# Patient Record
Sex: Female | Born: 1985 | Race: White | Hispanic: No | Marital: Married | Smoking: Never smoker
Health system: Southern US, Community
[De-identification: ages and names within clinical notes are randomized; demographics above are authoritative.]

## PROBLEM LIST (undated history)

## (undated) ENCOUNTER — Inpatient Hospital Stay (HOSPITAL_COMMUNITY): Payer: Self-pay

## (undated) DIAGNOSIS — J302 Other seasonal allergic rhinitis: Secondary | ICD-10-CM

## (undated) DIAGNOSIS — F988 Other specified behavioral and emotional disorders with onset usually occurring in childhood and adolescence: Secondary | ICD-10-CM

## (undated) HISTORY — PX: ELBOW SURGERY: SHX618

## (undated) HISTORY — PX: WISDOM TOOTH EXTRACTION: SHX21

---
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
## 2006-02-17 ENCOUNTER — Other Ambulatory Visit: Admission: RE | Admit: 2006-02-17 | Discharge: 2006-02-17 | Payer: Self-pay | Admitting: Obstetrics & Gynecology

## 2007-02-22 ENCOUNTER — Other Ambulatory Visit: Admission: RE | Admit: 2007-02-22 | Discharge: 2007-02-22 | Payer: Self-pay | Admitting: Obstetrics and Gynecology

## 2007-05-12 IMAGING — US US ABDOMEN COMPLETE
1 series · 14 of 25 positions shown · non-contrast
Comparison: none

CLINICAL DATA: Abdominal pain.  
 ABDOMINAL ULTRASOUND COMPLETE:
TECHNIQUE: Complete abdominal ultrasound examination was performed including evaluation of the liver, gallbladder, bile ducts, pancreas, kidneys, spleen, IVC, and abdominal aorta.

[Series 1: us abdomen complete · 0.32mm/px · 14 of 66 slices shown]
[im 1/66]
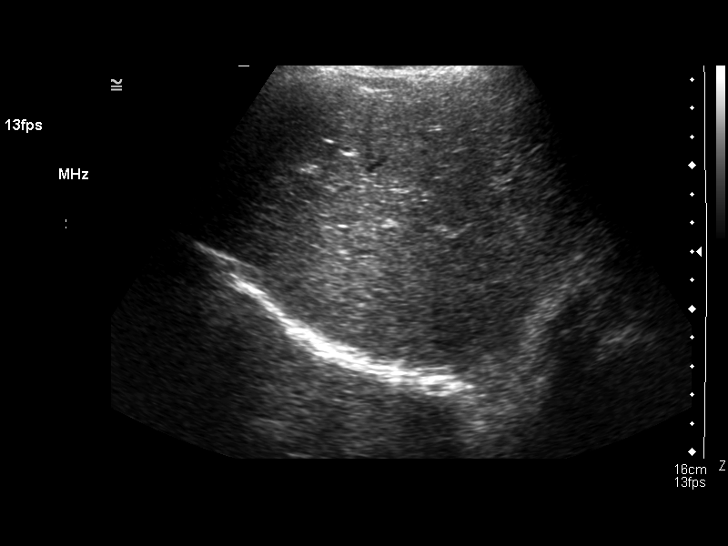
[im 6/66]
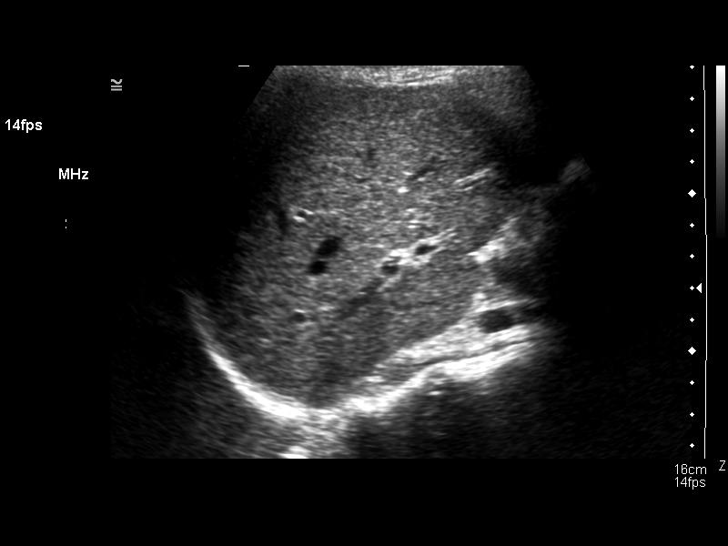
[im 11/66]
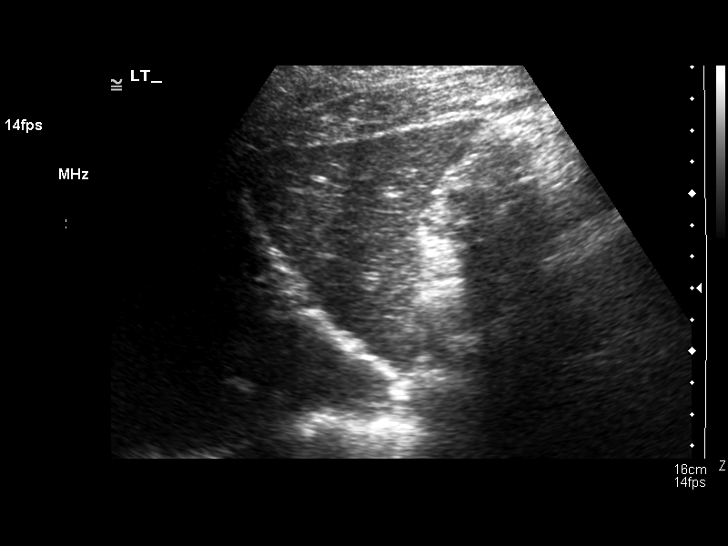
[im 17/66]
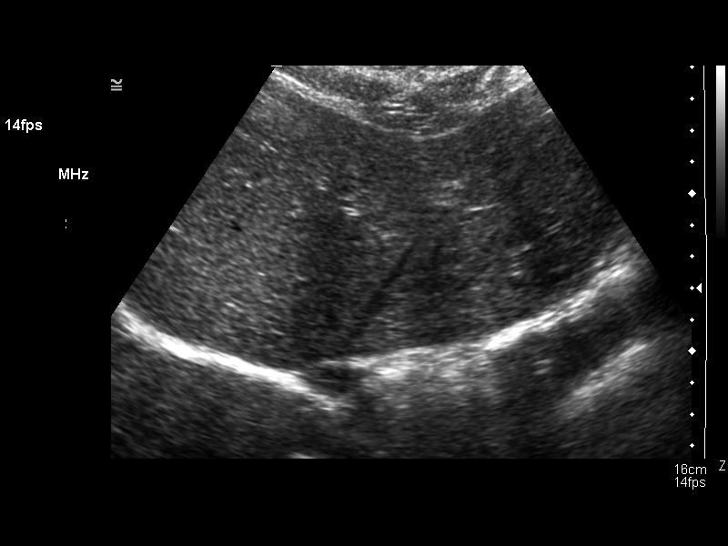
[im 22/66]
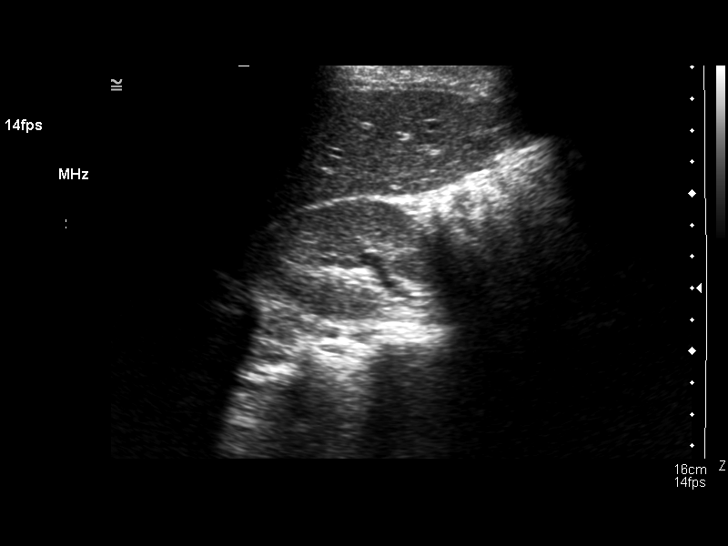
[im 25/66]
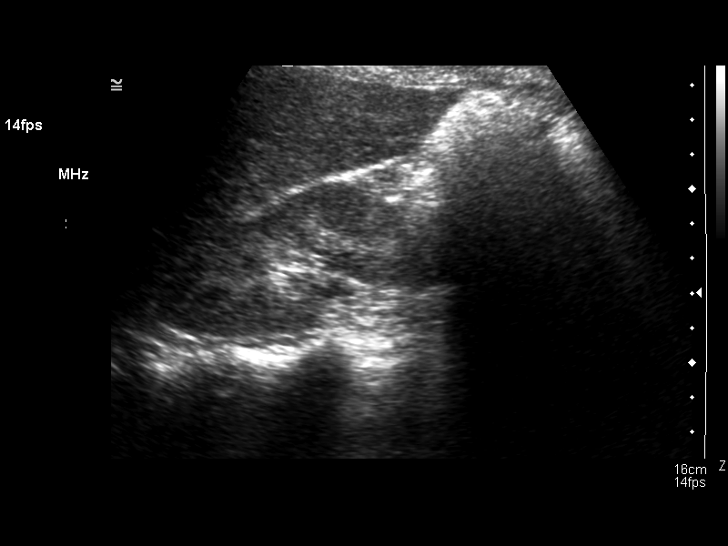
[im 30/66]
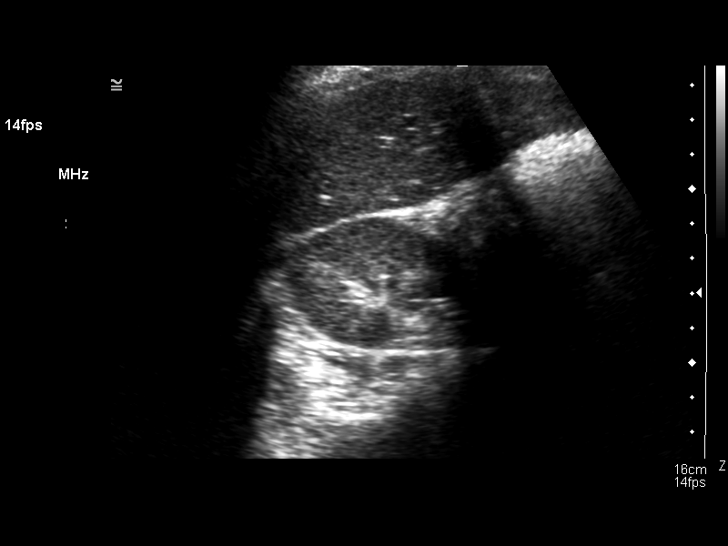
[im 36/66]
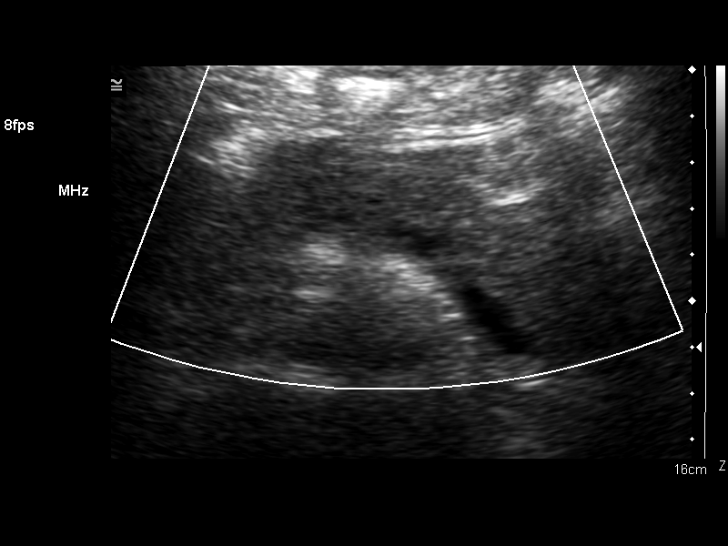
[im 41/66]
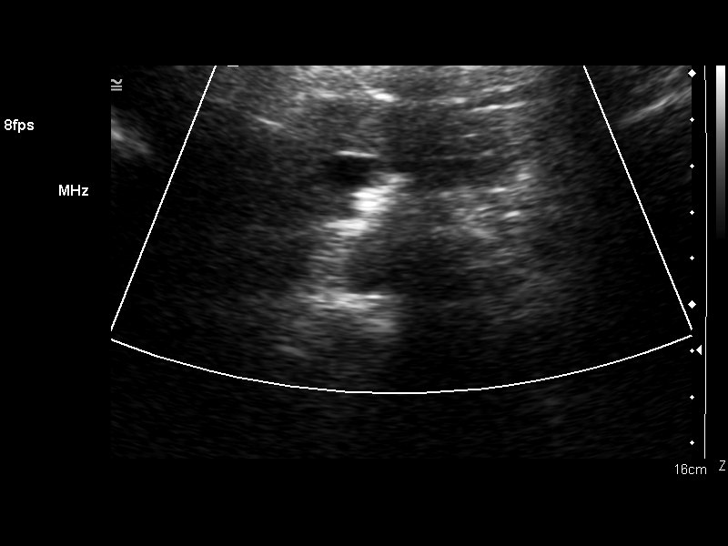
[im 44/66]
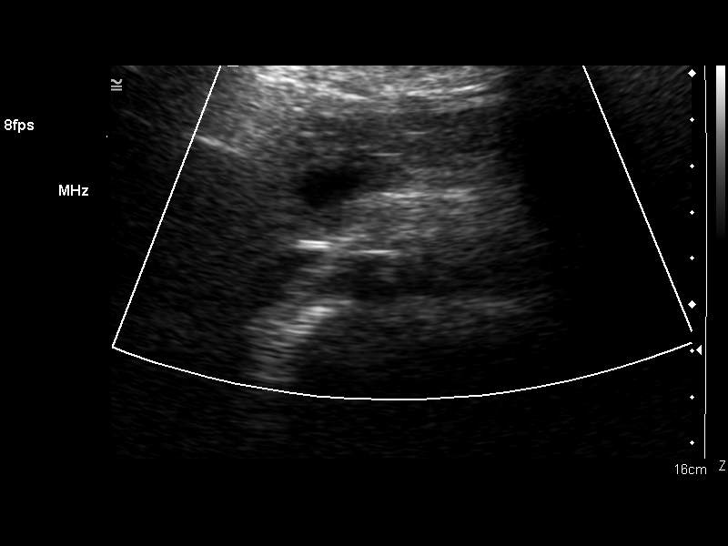
[im 49/66]
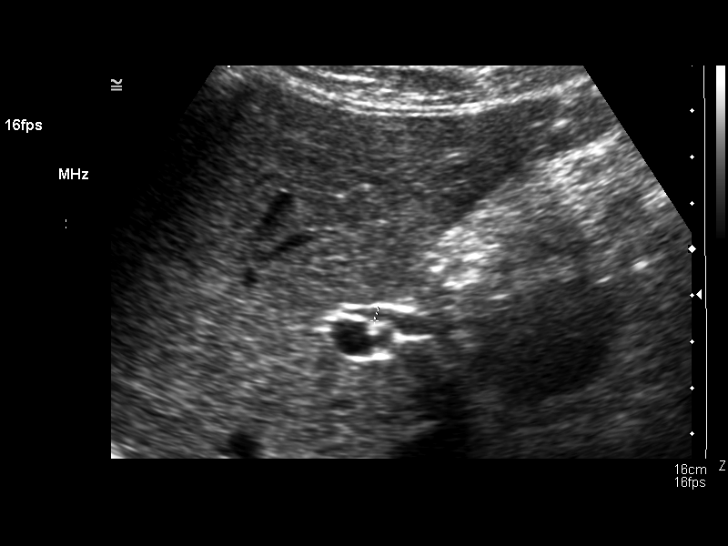
[im 55/66]
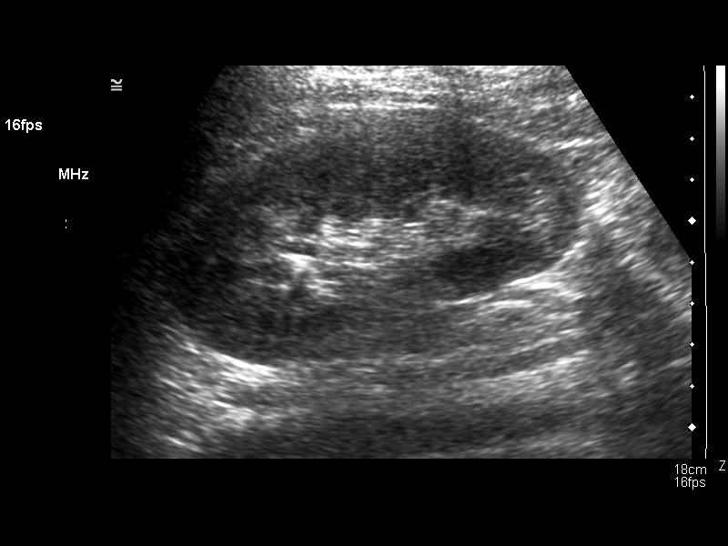
[im 60/66]
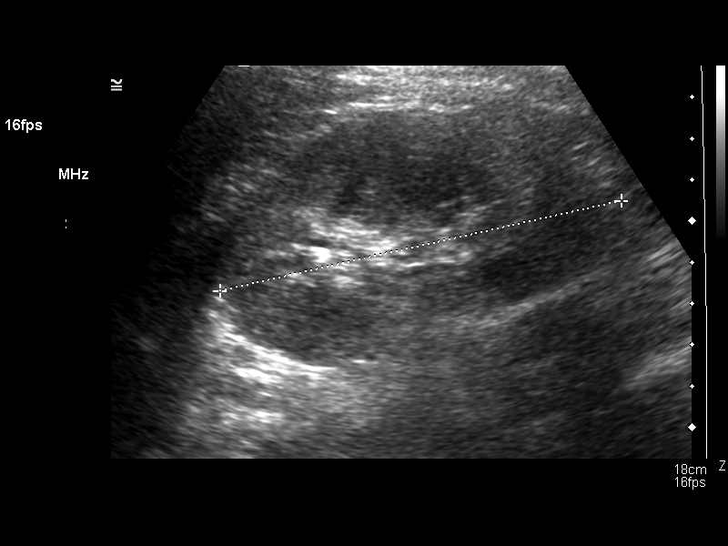
[im 66/66]
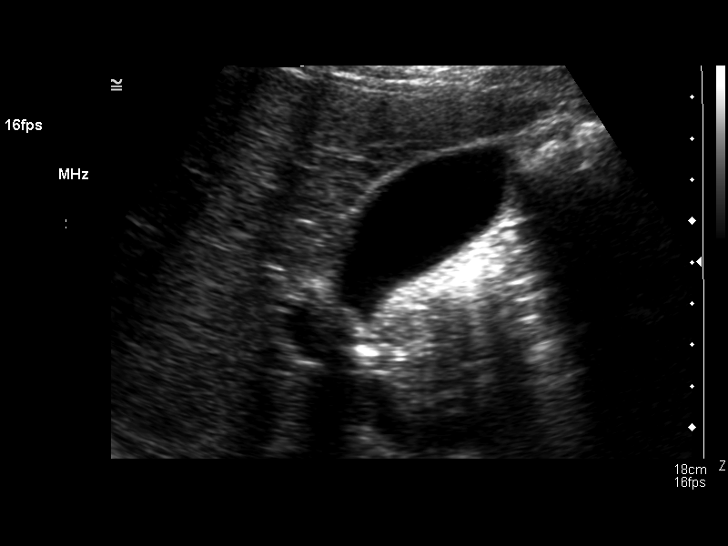

[14 of 25 positions shown; findings below may reference images not displayed]

FINDINGS: Scans over the upper abdomen were performed.  The gallbladder is well seen and no gallstones are noted.  The liver has a normal echogenic pattern.  The common bile duct is normal measuring between 2.0 and 3.1 mm in diameter.  The pancreas appears normal in the mid body with portions of both the head and the tail obscured by bowel gas.  IVC and spleen appear normal.  No hydronephrosis is noted.  The right kidney measures 9.9 cm sagittally with the left kidney measuring 10.0 cm.  The abdominal aorta is normal in caliber.
IMPRESSION: Negative abdominal ultrasound.  No gallstones.  Portions of the pancreas are obscured by bowel gas.

## 2008-02-25 ENCOUNTER — Other Ambulatory Visit: Admission: RE | Admit: 2008-02-25 | Discharge: 2008-02-25 | Payer: Self-pay | Admitting: Obstetrics and Gynecology

## 2010-09-29 ENCOUNTER — Encounter: Payer: Self-pay | Admitting: Gastroenterology

## 2013-03-10 ENCOUNTER — Ambulatory Visit (INDEPENDENT_AMBULATORY_CARE_PROVIDER_SITE_OTHER): Payer: BC Managed Care – PPO | Admitting: Nurse Practitioner

## 2013-03-10 ENCOUNTER — Encounter: Payer: Self-pay | Admitting: Nurse Practitioner

## 2013-03-10 VITALS — BP 106/58 | HR 66 | Resp 12 | Ht 61.0 in | Wt 144.4 lb

## 2013-03-10 DIAGNOSIS — Z0000 Encounter for general adult medical examination without abnormal findings: Secondary | ICD-10-CM

## 2013-03-10 DIAGNOSIS — Z01419 Encounter for gynecological examination (general) (routine) without abnormal findings: Secondary | ICD-10-CM

## 2013-03-10 LAB — HEMOGLOBIN, FINGERSTICK: Hemoglobin, fingerstick: 13.5 g/dL (ref 12.0–16.0)

## 2013-03-10 LAB — POCT URINALYSIS DIPSTICK
Leukocytes, UA: NEGATIVE
Spec Grav, UA: 1.015
Urobilinogen, UA: NEGATIVE
pH, UA: 5.5

## 2013-03-10 NOTE — Progress Notes (Signed)
27 y.o. No obstetric history on file. Married Caucasian Fe here for annual exam.  Off OCP now for 2 months.  Trying for pregnancy this past week only.  Still on prenatal multivitamin. Menses is regular and last for 4 days. Off Prozac about 1.5 months and doing OK.  Patient's last menstrual period was 02/21/2013.          Sexually active: yes  The current method of family planning is none.    Exercising: yes  walking and jogging Smoker:  no  Health Maintenance: Pap:  03/08/2012  negative TDaP:  2008- per pt Gardasil completed 08/27/06 Labs: Hgb- 13.5    reports that she has never smoked. She has never used smokeless tobacco. She reports that she drinks about 0.5 ounces of alcohol per week. She reports that she does not use illicit drugs.  History reviewed. No pertinent past medical history.  Past Surgical History  Procedure Laterality Date  . Wisdom tooth extraction      Current Outpatient Prescriptions  Medication Sig Dispense Refill  . Prenatal Multivit-Min-Fe-FA (PRE-NATAL FORMULA) TABS Take by mouth.       No current facility-administered medications for this visit.    Family History  Problem Relation Age of Onset  . Hypertension Mother   . Hypertension Father   . Heart failure Maternal Grandfather     ROS:  Pertinent items are noted in HPI.  Otherwise, a comprehensive ROS was negative.  Exam:   Ht 5\' 1"  (1.549 m)  Wt 144 lb 6.4 oz (65.499 kg)  BMI 27.3 kg/m2  LMP 02/21/2013 Height: 5\' 1"  (154.9 cm)  Ht Readings from Last 3 Encounters:  03/10/13 5\' 1"  (1.549 m)    General appearance: alert, cooperative and appears stated age Head: Normocephalic, without obvious abnormality, atraumatic Neck: no adenopathy, supple, symmetrical, trachea midline and thyroid normal to inspection and palpation Lungs: clear to auscultation bilaterally Breasts: normal appearance, no masses or tenderness Heart: regular rate and rhythm Abdomen: soft, non-tender; no masses,  no  organomegaly Extremities: extremities normal, atraumatic, no cyanosis or edema Skin: Skin color, texture, turgor normal. No rashes or lesions Lymph nodes: Cervical, supraclavicular, and axillary nodes normal. No abnormal inguinal nodes palpated Neurologic: Grossly normal   Pelvic: External genitalia:  no lesions              Urethra:  normal appearing urethra with no masses, tenderness or lesions              Bartholin's and Skene's: normal                 Vagina: normal appearing vagina with normal color and discharge, no lesions              Cervix: anteverted              Pap taken: no Bimanual Exam:  Uterus:  normal size, contour, position, consistency, mobility, non-tender              Adnexa: no mass, fullness, tenderness               Rectovaginal: Confirms               Anus:  normal sphincter tone, no lesions  A:  Well Woman with normal exam  Trying for pregnancy  History of situational stressors - off  Prozac X 2 months and stable    P:   Pap smear as per guidelines   Reviewed most likely times to get  pregnant and to continue with prenatal multivitamin  counseled on breast self exam, adequate intake of calcium and vitamin D, diet and exercise return annually or prn  An After Visit Summary was printed and given to the patient.

## 2013-03-10 NOTE — Patient Instructions (Addendum)
General topics  Next pap or exam is  due in 1 year Take a Women's multivitamin Take 1200 mg. of calcium daily - prefer dietary If any concerns in interim to call back  Breast Self-Awareness Practicing breast self-awareness may pick up problems early, prevent significant medical complications, and possibly save your life. By practicing breast self-awareness, you can become familiar with how your breasts look and feel and if your breasts are changing. This allows you to notice changes early. It can also offer you some reassurance that your breast health is good. One way to learn what is normal for your breasts and whether your breasts are changing is to do a breast self-exam. If you find a lump or something that was not present in the past, it is best to contact your caregiver right away. Other findings that should be evaluated by your caregiver include nipple discharge, especially if it is bloody; skin changes or reddening; areas where the skin seems to be pulled in (retracted); or new lumps and bumps. Breast pain is seldom associated with cancer (malignancy), but should also be evaluated by a caregiver. BREAST SELF-EXAM The best time to examine your breasts is 5 7 days after your menstrual period is over.  ExitCare Patient Information 2013 [employer].   Exercise to Stay Healthy Exercise helps you become and stay healthy. EXERCISE IDEAS AND TIPS Choose exercises that:  You enjoy.  Fit into your day. You do not need to exercise really hard to be healthy. You can do exercises at a slow or medium level and stay healthy. You can:  Stretch before and after working out.  Try yoga, Pilates, or tai chi.  Lift weights.  Walk fast, swim, jog, run, climb stairs, bicycle, dance, or rollerskate.  Take aerobic classes. Exercises that burn about 150 calories:  Running 1  miles in 15 minutes.  Playing volleyball for 45 to 60 minutes.  Washing and waxing a car for 45 to 60  minutes.  Playing touch football for 45 minutes.  Walking 1  miles in 35 minutes.  Pushing a stroller 1  miles in 30 minutes.  Playing basketball for 30 minutes.  Raking leaves for 30 minutes.  Bicycling 5 miles in 30 minutes.  Walking 2 miles in 30 minutes.  Dancing for 30 minutes.  Shoveling snow for 15 minutes.  Swimming laps for 20 minutes.  Walking up stairs for 15 minutes.  Bicycling 4 miles in 15 minutes.  Gardening for 30 to 45 minutes.  Jumping rope for 15 minutes.  Washing windows or floors for 45 to 60 minutes. Document Released: 09/27/2010 Document Revised: 11/17/2011 Document Reviewed: 09/27/2010 ExitCare Patient Information 2013 [employer].   Other topics ( that may be useful information):    Sexually Transmitted Disease Sexually transmitted disease (STD) refers to any infection that is passed from person to person during sexual activity. This may happen by way of saliva, semen, blood, vaginal mucus, or urine. Common STDs include:  Gonorrhea.  Chlamydia.  Syphilis.  HIV/AIDS.  Genital herpes.  Hepatitis B and C.  Trichomonas.  Human papillomavirus (HPV).  Pubic lice. CAUSES  An STD may be spread by bacteria, virus, or parasite. A person can get an STD by:  Sexual intercourse with an infected person.  Sharing sex toys with an infected person.  Sharing needles with an infected person.  Having intimate contact with the genitals, mouth, or rectal areas of an infected person. SYMPTOMS  Some people may not have any symptoms, but   they can still pass the infection to others. Different STDs have different symptoms. Symptoms include:  Painful or bloody urination.  Pain in the pelvis, abdomen, vagina, anus, throat, or eyes.  Skin rash, itching, irritation, growths, or sores (lesions). These usually occur in the genital or anal area.  Abnormal vaginal discharge.  Penile discharge in men.  Soft, flesh-colored skin growths in the  genital or anal area.  Fever.  Pain or bleeding during sexual intercourse.  Swollen glands in the groin area.  Yellow skin and eyes (jaundice). This is seen with hepatitis. DIAGNOSIS  To make a diagnosis, your caregiver may:  Take a medical history.  Perform a physical exam.  Take a specimen (culture) to be examined.  Examine a sample of discharge under a microscope.  Perform blood test TREATMENT   Chlamydia, gonorrhea, trichomonas, and syphilis can be cured with antibiotic medicine.  Genital herpes, hepatitis, and HIV can be treated, but not cured, with prescribed medicines. The medicines will lessen the symptoms.  Genital warts from HPV can be treated with medicine or by freezing, burning (electrocautery), or surgery. Warts may come back.  HPV is a virus and cannot be cured with medicine or surgery.However, abnormal areas may be followed very closely by your caregiver and may be removed from the cervix, vagina, or vulva through office procedures or surgery. If your diagnosis is confirmed, your recent sexual partners need treatment. This is true even if they are symptom-free or have a negative culture or evaluation. They should not have sex until their caregiver says it is okay. HOME CARE INSTRUCTIONS  All sexual partners should be informed, tested, and treated for all STDs.  Take your antibiotics as directed. Finish them even if you start to feel better.  Only take over-the-counter or prescription medicines for pain, discomfort, or fever as directed by your caregiver.  Rest.  Eat a balanced diet and drink enough fluids to keep your urine clear or pale yellow.  Do not have sex until treatment is completed and you have followed up with your caregiver. STDs should be checked after treatment.  Keep all follow-up appointments, Pap tests, and blood tests as directed by your caregiver.  Only use latex condoms and water-soluble lubricants during sexual activity. Do not use  petroleum jelly or oils.  Avoid alcohol and illegal drugs.  Get vaccinated for HPV and hepatitis. If you have not received these vaccines in the past, talk to your caregiver about whether one or both might be right for you.  Avoid risky sex practices that can break the skin. The only way to avoid getting an STD is to avoid all sexual activity.Latex condoms and dental dams (for oral sex) will help lessen the risk of getting an STD, but will not completely eliminate the risk. SEEK MEDICAL CARE IF:   You have a fever.  You have any new or worsening symptoms. Document Released: 11/15/2002 Document Revised: 11/17/2011 Document Reviewed: 11/22/2010 [facility] Patient Information 2013 [place].    Domestic Abuse You are being battered or abused if someone close to you hits, pushes, or physically hurts you in any way. You also are being abused if you are forced into activities. You are being sexually abused if you are forced to have sexual contact of any kind. You are being emotionally abused if you are made to feel worthless or if you are constantly threatened. It is important to remember that help is available. No one has the right to abuse you. PREVENTION OF FURTHER  ABUSE  Learn the warning signs of danger. This varies with situations but may include: the use of alcohol, threats, isolation from friends and family, or forced sexual contact. Leave if you feel that violence is going to occur.  If you are attacked or beaten, report it to the police so the abuse is documented. You do not have to press charges. The police can protect you while you or the attackers are leaving. Get the officer's name and badge number and a copy of the report.  Find someone you can trust and tell them what is happening to you: your caregiver, a nurse, clergy member, close friend or family member. Feeling ashamed is natural, but remember that you have done nothing wrong. No one deserves abuse. Document Released:  08/22/2000 Document Revised: 11/17/2011 Document Reviewed: 10/31/2010 ExitCare Patient Information 2013 [employer].    How Much is Too Much Alcohol? Drinking too much alcohol can cause injury, accidents, and health problems. These types of problems can include:   Car crashes.  Falls.  Family fighting (domestic violence).  Drowning.  Fights.  Injuries.  Burns.  Damage to certain organs.  Having a baby with birth defects. ONE DRINK CAN BE TOO MUCH WHEN YOU ARE:  Working.  Pregnant or breastfeeding.  Taking medicines. Ask your doctor.  Driving or planning to drive. If you or someone you know has a drinking problem, get help from a doctor.  Document Released: 06/21/2009 Document Revised: 11/17/2011 Document Reviewed: 06/21/2009 ExitCare Patient Information 2013 [employer].   Smoking Hazards Smoking cigarettes is extremely bad for your health. Tobacco smoke has over 200 known poisons in it. There are over 60 chemicals in tobacco smoke that cause cancer. Some of the chemicals found in cigarette smoke include:   Cyanide.  Benzene.  Formaldehyde.  Methanol (wood alcohol).  Acetylene (fuel used in welding torches).  Ammonia. Cigarette smoke also contains the poisonous gases nitrogen oxide and carbon monoxide.  Cigarette smokers have an increased risk of many serious medical problems and Smoking causes approximately:  90% of all lung cancer deaths in men.  80% of all lung cancer deaths in women.  90% of deaths from chronic obstructive lung disease. Compared with nonsmokers, smoking increases the risk of:  Coronary heart disease by 2 to 4 times.  Stroke by 2 to 4 times.  Men developing lung cancer by 23 times.  Women developing lung cancer by 13 times.  Dying from chronic obstructive lung diseases by 12 times.  . Smoking is the most preventable cause of death and disease in our society.  WHY IS SMOKING ADDICTIVE?  Nicotine is the chemical  agent in tobacco that is capable of causing addiction or dependence.  When you smoke and inhale, nicotine is absorbed rapidly into the bloodstream through your lungs. Nicotine absorbed through the lungs is capable of creating a powerful addiction. Both inhaled and non-inhaled nicotine may be addictive.  Addiction studies of cigarettes and spit tobacco show that addiction to nicotine occurs mainly during the teen years, when young people begin using tobacco products. WHAT ARE THE BENEFITS OF QUITTING?  There are many health benefits to quitting smoking.   Likelihood of developing cancer and heart disease decreases. Health improvements are seen almost immediately.  Blood pressure, pulse rate, and breathing patterns start returning to normal soon after quitting. QUITTING SMOKING   American Lung Association - 1-800-LUNGUSA  American Cancer Society - 1-800-ACS-2345 Document Released: 10/02/2004 Document Revised: 11/17/2011 Document Reviewed: 06/06/2009 ExitCare Patient Information 2013 ExitCare[employer]   LLC.   Stress Management Stress is a state of physical or mental tension that often results from changes in your life or normal routine. Some common causes of stress are:  Death of a loved one.  Injuries or severe illnesses.  Getting fired or changing jobs.  Moving into a new home. Other causes may be:  Sexual problems.  Business or financial losses.  Taking on a large debt.  Regular conflict with someone at home or at work.  Constant tiredness from lack of sleep. It is not just bad things that are stressful. It may be stressful to:  Win the lottery.  Get married.  Buy a new car. The amount of stress that can be easily tolerated varies from person to person. Changes generally cause stress, regardless of the types of change. Too much stress can affect your health. It may lead to physical or emotional problems. Too little stress (boredom) may also become stressful. SUGGESTIONS TO  REDUCE STRESS:  Talk things over with your family and friends. It often is helpful to share your concerns and worries. If you feel your problem is serious, you may want to get help from a professional counselor.  Consider your problems one at a time instead of lumping them all together. Trying to take care of everything at once may seem impossible. List all the things you need to do and then start with the most important one. Set a goal to accomplish 2 or 3 things each day. If you expect to do too many in a single day you will naturally fail, causing you to feel even more stressed.  Do not use alcohol or drugs to relieve stress. Although you may feel better for a short time, they do not remove the problems that caused the stress. They can also be habit forming.  Exercise regularly - at least 3 times per week. Physical exercise can help to relieve that "uptight" feeling and will relax you.  The shortest distance between despair and hope is often a good night's sleep.  Go to bed and get up on time allowing yourself time for appointments without being rushed.  Take a short "time-out" period from any stressful situation that occurs during the day. Close your eyes and take some deep breaths. Starting with the muscles in your face, tense them, hold it for a few seconds, then relax. Repeat this with the muscles in your neck, shoulders, hand, stomach, back and legs.  Take good care of yourself. Eat a balanced diet and get plenty of rest.  Schedule time for having fun. Take a break from your daily routine to relax. HOME CARE INSTRUCTIONS   Call if you feel overwhelmed by your problems and feel you can no longer manage them on your own.  Return immediately if you feel like hurting yourself or someone else. Document Released: 02/18/2001 Document Revised: 11/17/2011 Document Reviewed: 10/11/2007 [facility] Patient Information 2013 [place], [place].    Preparing for Pregnancy Preparing for pregnancy  (preconceptual care) by getting counseling and information from your caregiver before getting pregnant is a good idea. It will help you and your baby have a better chance to have a healthy, safe pregnancy and delivery of your baby. Make an appointment with your caregiver to talk about your health, medical, and family history and how to prepare yourself before getting pregnant. Your caregiver will do a complete physical exam and a Pap test. They will want to know:  About you, your spouse or partner, and your family's  medical and genetic history.  If you are eating a balanced diet and drinking enough fluids.  What vitamins and mineral supplements you are taking. This includes taking folic acid before getting pregnant to help prevent birth defects.  What medications you are taking including prescription, over-the-counter and herbal medications.  If there is any substance abuse like alcohol, smoking, and illegal drugs.  If there is any mental or physical domestic violence.  If there is any risk of sexually transmitted disease between you and your partner.  What immunizations and vaccinations you have had and what you may need before getting pregnant.  If you should get tested for HIV infection.  If there is any exposure to chemical or toxic substances at home or work.  If there are medical problems you have that need to be treated and kept under control before getting pregnant such as diabetes, high blood pressure or others.  If there were any past surgeries, pregnancies and problems with them.  What your current weight is and to set a goal as to how much weight you should gain while pregnant. Also, they will check if you should lose or gain weight before getting pregnant.  What is your exercise routine and what it is safe when you are pregnant.  If there are any physical disabilities that need to be addressed.  About spacing your pregnancies when there are other children.  If there is  a financial problem that may affect you having a child. After talking about the above points with your caregiver, your caregiver will give you advice on how to help treat and work with you on solving any issues, if necessary, before getting pregnant. The goal is to have a healthy and safe pregnancy for you and your baby. You should keep an accurate record of your menstrual periods because it will help in determining your due date. Immunizations that you should have before getting pregnant:   Regular measles, [place] measles (rubella) and mumps.  Tetanus and diphtheria.  Chickenpox, if not immune.  Herpes zoster (Varicella) if not immune.  Human papilloma virus vaccine (HPV) between the age of 33 and 41 years old.  Hepatitis A vaccine.  Hepatitis B vaccine.  Influenza vaccine.  Pneumococcal vaccine (pneumonia). You should avoid getting pregnant for one month after getting vaccinated with a live virus vaccine such as [place] measles (rubella) vaccine. Other immunizations may be necessary depending on where you live, such as malaria. Ask your caregiver if any other immunizations are needed for you. HOME CARE INSTRUCTIONS   Follow the advice of your caregiver.  Before getting pregnant:  Begin taking vitamins, supplements, and 0.4 milligrams folic acid daily.  Get your immunizations up-to-date.  Get help from a nutrition counselor if you do not understand what a balanced diet is, need help with a special medical diet or if you need help to lose or gain weight.  Begin exercising.  Stop smoking, taking illegal drugs, and drinking alcoholic beverages.  Get counseling if there is and type of domestic violence.  Get checked for sexually transmitted diseases including HIV.  Get any medical problems under control (diabetes, high blood pressure, convulsions, asthma or others).  Resolve any financial concerns.  Be sure you and your spouse or partner are ready to have a baby.  Keep an  accurate record of your menstrual periods. Document Released: 08/07/2008 Document Revised: 11/17/2011 Document Reviewed: 08/07/2008 [facility] Patient Information 2014 [place], [place].

## 2013-03-13 NOTE — Progress Notes (Signed)
Encounter reviewed by Dr. Jomes Giraldo Silva.  

## 2013-04-16 ENCOUNTER — Other Ambulatory Visit: Payer: Self-pay | Admitting: Nurse Practitioner

## 2013-05-04 LAB — OB RESULTS CONSOLE HGB/HCT, BLOOD
HCT: 38 %
Hemoglobin: 13 g/dL

## 2013-05-04 LAB — OB RESULTS CONSOLE ABO/RH: RH Type: NEGATIVE

## 2013-05-04 LAB — OB RESULTS CONSOLE HIV ANTIBODY (ROUTINE TESTING): HIV: NONREACTIVE

## 2013-05-04 LAB — OB RESULTS CONSOLE RUBELLA ANTIBODY, IGM: Rubella: IMMUNE

## 2013-05-04 LAB — OB RESULTS CONSOLE RPR: RPR: NONREACTIVE

## 2013-05-04 LAB — OB RESULTS CONSOLE HEPATITIS B SURFACE ANTIGEN: Hepatitis B Surface Ag: NEGATIVE

## 2013-05-04 LAB — OB RESULTS CONSOLE ANTIBODY SCREEN: Antibody Screen: NEGATIVE

## 2013-05-04 LAB — OB RESULTS CONSOLE GC/CHLAMYDIA
Chlamydia: NEGATIVE
Gonorrhea: NEGATIVE

## 2013-05-26 LAB — OB RESULTS CONSOLE GBS: GBS: POSITIVE

## 2013-09-08 NOTE — L&D Delivery Note (Signed)
Delivery Note At 9:27 PM a viable female was delivered via Vaginal, Spontaneous Delivery (Presentation: LOA ).  APGAR: 7, 9; weight 5 lb 12.6 oz (2625 g).   Placenta status: Intact, Spontaneous.  Cord: 3 vessels with the following complications: None.  Cord pH: 7.264  [facility]. Perineum was infultrated with lidocaine, episiotomy was cut and baby was born with next push.  Anesthesia: Local  Episiotomy: Median cut by Dr. Richardson Doppole Lacerations: 2nd degree;Perineal Suture Repair: 3.0 vicryl Est. Blood Loss (mL): 300  Mom to postpartum.  Baby to Couplet care / Skin to Skin.  Routine PP orders In patient circ Breastfeeding  Jessica LawsJennifer Seydina Rosario 12/14/2013, 3:25 AM

## 2013-12-13 ENCOUNTER — Encounter (HOSPITAL_COMMUNITY): Payer: Self-pay | Admitting: *Deleted

## 2013-12-13 ENCOUNTER — Inpatient Hospital Stay (HOSPITAL_COMMUNITY)
Admission: AD | Admit: 2013-12-13 | Discharge: 2013-12-15 | DRG: 775 | Disposition: A | Discharge status: Home / Self Care | Payer: BC Managed Care – PPO | Source: Ambulatory Visit | Attending: Obstetrics and Gynecology | Admitting: Obstetrics and Gynecology

## 2013-12-13 DIAGNOSIS — O9989 Other specified diseases and conditions complicating pregnancy, childbirth and the puerperium: Secondary | ICD-10-CM

## 2013-12-13 DIAGNOSIS — O360990 Maternal care for other rhesus isoimmunization, unspecified trimester, not applicable or unspecified: Secondary | ICD-10-CM | POA: Diagnosis present

## 2013-12-13 DIAGNOSIS — O99892 Other specified diseases and conditions complicating childbirth: Secondary | ICD-10-CM | POA: Diagnosis present

## 2013-12-13 DIAGNOSIS — O4290 Premature rupture of membranes, unspecified as to length of time between rupture and onset of labor, unspecified weeks of gestation: Principal | ICD-10-CM | POA: Diagnosis present

## 2013-12-13 DIAGNOSIS — O4292 Full-term premature rupture of membranes, unspecified as to length of time between rupture and onset of labor: Secondary | ICD-10-CM | POA: Diagnosis present

## 2013-12-13 DIAGNOSIS — Z22330 Carrier of Group B streptococcus: Secondary | ICD-10-CM

## 2013-12-13 HISTORY — DX: Other specified behavioral and emotional disorders with onset usually occurring in childhood and adolescence: F98.8

## 2013-12-13 HISTORY — DX: Other seasonal allergic rhinitis: J30.2

## 2013-12-13 LAB — CBC
HCT: 35.8 % — ABNORMAL LOW (ref 36.0–46.0)
Hemoglobin: 12.1 g/dL (ref 12.0–15.0)
MCH: 29.3 pg (ref 26.0–34.0)
MCHC: 33.8 g/dL (ref 30.0–36.0)
MCV: 86.7 fL (ref 78.0–100.0)
Platelets: 226 10*3/uL (ref 150–400)
RBC: 4.13 MIL/uL (ref 3.87–5.11)
RDW: 13.7 % (ref 11.5–15.5)
WBC: 18.8 10*3/uL — ABNORMAL HIGH (ref 4.0–10.5)

## 2013-12-13 LAB — PROTEIN / CREATININE RATIO, URINE
Creatinine, Urine: 49.01 mg/dL
Protein Creatinine Ratio: 0.11 (ref 0.00–0.15)
Total Protein, Urine: 5.4 mg/dL

## 2013-12-13 LAB — AMNISURE RUPTURE OF MEMBRANE (ROM) NOT AT ARMC: Amnisure ROM: POSITIVE

## 2013-12-13 LAB — COMPREHENSIVE METABOLIC PANEL
ALT: 10 U/L (ref 0–35)
AST: 14 U/L (ref 0–37)
Albumin: 2.7 g/dL — ABNORMAL LOW (ref 3.5–5.2)
Alkaline Phosphatase: 121 U/L — ABNORMAL HIGH (ref 39–117)
BUN: 13 mg/dL (ref 6–23)
CO2: 20 mEq/L (ref 19–32)
Calcium: 9.9 mg/dL (ref 8.4–10.5)
Chloride: 103 mEq/L (ref 96–112)
Creatinine, Ser: 0.66 mg/dL (ref 0.50–1.10)
GFR calc Af Amer: >90 mL/min (ref >90)
GFR calc non Af Amer: >90 mL/min (ref >90)
Glucose, Bld: 75 mg/dL (ref 70–99)
Potassium: 4.1 mEq/L (ref 3.7–5.3)
Sodium: 137 mEq/L (ref 137–147)
Total Bilirubin: <0.2 mg/dL — ABNORMAL LOW (ref 0.3–1.2)
Total Protein: 6.5 g/dL (ref 6.0–8.3)

## 2013-12-13 LAB — ABO/RH: ABO/RH(D): A NEG

## 2013-12-13 LAB — URIC ACID: Uric Acid, Serum: 2.7 mg/dL (ref 2.4–7.0)

## 2013-12-13 LAB — LACTATE DEHYDROGENASE: LDH: 151 U/L (ref 94–250)

## 2013-12-13 LAB — RPR

## 2013-12-13 MED ORDER — SIMETHICONE 80 MG PO CHEW: 80.0000 mg | CHEWABLE_TABLET | ORAL | Status: DC | PRN

## 2013-12-13 MED ORDER — PENICILLIN G POTASSIUM 5000000 UNITS IJ SOLR
5.0000 10*6.[IU] | Freq: Once | INTRAMUSCULAR | Status: AC
  Administered 2013-12-13: 5 10*6.[IU] via INTRAVENOUS
  Filled 2013-12-13: qty 5

## 2013-12-13 MED ORDER — IBUPROFEN 600 MG PO TABS
600.0000 mg | ORAL_TABLET | Freq: Four times a day (QID) | ORAL | Status: DC
  Administered 2013-12-14 – 2013-12-15 (×6): 600 mg via ORAL
  Filled 2013-12-13 (×6): qty 1

## 2013-12-13 MED ORDER — FENTANYL CITRATE 0.05 MG/ML IJ SOLN
50.0000 ug | INTRAMUSCULAR | Status: DC | PRN
  Administered 2013-12-13: 50 ug via INTRAVENOUS

## 2013-12-13 MED ORDER — LACTATED RINGERS IV SOLN: 500.0000 mL | INTRAVENOUS | Status: DC | PRN

## 2013-12-13 MED ORDER — OXYTOCIN BOLUS FROM INFUSION
500.0000 mL | INTRAVENOUS | Status: DC
  Administered 2013-12-13: 500 mL via INTRAVENOUS

## 2013-12-13 MED ORDER — FENTANYL 2.5 MCG/ML BUPIVACAINE 1/10 % EPIDURAL INFUSION (WH - ANES): 14.0000 mL/h | INTRAMUSCULAR | Status: DC | PRN

## 2013-12-13 MED ORDER — IBUPROFEN 600 MG PO TABS
600.0000 mg | ORAL_TABLET | Freq: Four times a day (QID) | ORAL | Status: DC | PRN
  Administered 2013-12-13: 600 mg via ORAL
  Filled 2013-12-13: qty 1

## 2013-12-13 MED ORDER — SENNOSIDES-DOCUSATE SODIUM 8.6-50 MG PO TABS
2.0000 | ORAL_TABLET | ORAL | Status: DC
  Administered 2013-12-14 – 2013-12-15 (×2): 2 via ORAL
  Filled 2013-12-13 (×2): qty 2

## 2013-12-13 MED ORDER — ONDANSETRON HCL 4 MG/2ML IJ SOLN: 4.0000 mg | INTRAMUSCULAR | Status: DC | PRN

## 2013-12-13 MED ORDER — CITRIC ACID-SODIUM CITRATE 334-500 MG/5ML PO SOLN: 30.0000 mL | ORAL | Status: DC | PRN

## 2013-12-13 MED ORDER — FENTANYL CITRATE 0.05 MG/ML IJ SOLN
INTRAMUSCULAR | Status: AC
  Filled 2013-12-13: qty 2

## 2013-12-13 MED ORDER — ZOLPIDEM TARTRATE 5 MG PO TABS: 5.0000 mg | ORAL_TABLET | Freq: Every evening | ORAL | Status: DC | PRN

## 2013-12-13 MED ORDER — FENTANYL CITRATE 0.05 MG/ML IJ SOLN
50.0000 ug | INTRAMUSCULAR | Status: DC | PRN
  Administered 2013-12-13: 50 ug via INTRAVENOUS
  Filled 2013-12-13: qty 2

## 2013-12-13 MED ORDER — DIPHENHYDRAMINE HCL 50 MG/ML IJ SOLN: 12.5000 mg | INTRAMUSCULAR | Status: DC | PRN

## 2013-12-13 MED ORDER — EPHEDRINE 5 MG/ML INJ
10.0000 mg | INTRAVENOUS | Status: DC | PRN
  Filled 2013-12-13: qty 2

## 2013-12-13 MED ORDER — DIPHENHYDRAMINE HCL 25 MG PO CAPS: 25.0000 mg | ORAL_CAPSULE | Freq: Four times a day (QID) | ORAL | Status: DC | PRN

## 2013-12-13 MED ORDER — TERBUTALINE SULFATE 1 MG/ML IJ SOLN: 0.2500 mg | Freq: Once | INTRAMUSCULAR | Status: DC | PRN

## 2013-12-13 MED ORDER — PRENATAL MULTIVITAMIN CH
1.0000 | ORAL_TABLET | Freq: Every day | ORAL | Status: DC
  Administered 2013-12-14: 1 via ORAL
  Filled 2013-12-13: qty 1

## 2013-12-13 MED ORDER — LANOLIN HYDROUS EX OINT: TOPICAL_OINTMENT | CUTANEOUS | Status: DC | PRN

## 2013-12-13 MED ORDER — PHENYLEPHRINE 40 MCG/ML (10ML) SYRINGE FOR IV PUSH (FOR BLOOD PRESSURE SUPPORT)
80.0000 ug | PREFILLED_SYRINGE | INTRAVENOUS | Status: DC | PRN
  Filled 2013-12-13: qty 2

## 2013-12-13 MED ORDER — LIDOCAINE HCL (PF) 1 % IJ SOLN
30.0000 mL | INTRAMUSCULAR | Status: AC | PRN
  Administered 2013-12-13: 30 mL via SUBCUTANEOUS
  Filled 2013-12-13: qty 30

## 2013-12-13 MED ORDER — DIBUCAINE 1 % RE OINT: 1.0000 "application " | TOPICAL_OINTMENT | RECTAL | Status: DC | PRN

## 2013-12-13 MED ORDER — PENICILLIN G POTASSIUM 5000000 UNITS IJ SOLR
2.5000 10*6.[IU] | INTRAVENOUS | Status: DC
  Administered 2013-12-13: 2.5 10*6.[IU] via INTRAVENOUS
  Filled 2013-12-13 (×6): qty 2.5

## 2013-12-13 MED ORDER — OXYCODONE-ACETAMINOPHEN 5-325 MG PO TABS: 1.0000 | ORAL_TABLET | ORAL | Status: DC | PRN

## 2013-12-13 MED ORDER — ACETAMINOPHEN 325 MG PO TABS: 650.0000 mg | ORAL_TABLET | ORAL | Status: DC | PRN

## 2013-12-13 MED ORDER — BENZOCAINE-MENTHOL 20-0.5 % EX AERO: 1.0000 "application " | INHALATION_SPRAY | CUTANEOUS | Status: DC | PRN

## 2013-12-13 MED ORDER — ONDANSETRON HCL 4 MG/2ML IJ SOLN
4.0000 mg | Freq: Four times a day (QID) | INTRAMUSCULAR | Status: DC | PRN
  Administered 2013-12-13: 4 mg via INTRAVENOUS
  Filled 2013-12-13: qty 2

## 2013-12-13 MED ORDER — OXYTOCIN 40 UNITS IN LACTATED RINGERS INFUSION - SIMPLE MED
62.5000 mL/h | INTRAVENOUS | Status: DC
  Filled 2013-12-13: qty 1000

## 2013-12-13 MED ORDER — OXYTOCIN 40 UNITS IN LACTATED RINGERS INFUSION - SIMPLE MED
1.0000 m[IU]/min | INTRAVENOUS | Status: DC
  Administered 2013-12-13: 4 m[IU]/min via INTRAVENOUS
  Administered 2013-12-13: 5 m[IU]/min via INTRAVENOUS
  Administered 2013-12-13: 6 m[IU]/min via INTRAVENOUS
  Administered 2013-12-13: 1 m[IU]/min via INTRAVENOUS
  Administered 2013-12-13: 3 m[IU]/min via INTRAVENOUS

## 2013-12-13 MED ORDER — TETANUS-DIPHTH-ACELL PERTUSSIS 5-2.5-18.5 LF-MCG/0.5 IM SUSP: 0.5000 mL | Freq: Once | INTRAMUSCULAR | Status: DC

## 2013-12-13 MED ORDER — ONDANSETRON HCL 4 MG PO TABS: 4.0000 mg | ORAL_TABLET | ORAL | Status: DC | PRN

## 2013-12-13 MED ORDER — LACTATED RINGERS IV SOLN: 500.0000 mL | Freq: Once | INTRAVENOUS | Status: DC

## 2013-12-13 MED ORDER — LACTATED RINGERS IV SOLN
INTRAVENOUS | Status: DC
  Administered 2013-12-13: 15:00:00 via INTRAVENOUS

## 2013-12-13 MED ORDER — WITCH HAZEL-GLYCERIN EX PADS: 1.0000 "application " | MEDICATED_PAD | CUTANEOUS | Status: DC | PRN

## 2013-12-13 NOTE — MAU Note (Signed)
States water broke at 0515, contractions started around 0645.

## 2013-12-13 NOTE — Progress Notes (Signed)
Nurse called requesting pain medication for patient.  Vaginal exam completed by nurse.  Will give fentanyl IV.   Preesha Benjamin LYNN, CNM

## 2013-12-13 NOTE — H&P (Signed)
  Genia DelKristin M Cartmell is a 28 y.o. female, G1P0 at 38.4 weeks, presenting for PROM at [id number].  Patient states she began having contractions around 645, but they have been mild and irregular.  Currently contractions remain mild and irregular.  Patient desires waterbirth, but has been experiencing elevated b/p.  Due to this, patient is in need of continuous monitoring of vitals and fetus, therefore making her ineligible for waterbirth. Patient has received tdap vaccine and rhophylac at 30wks.  There are no active problems to display for this patient.   History of present pregnancy: Patient entered care at 9.6 weeks.   EDC of 12/23/2013 was established by Definite LMP of 03/18/2013.   Anatomy scan:  18.3 weeks, with normal findings and an anterior placenta.   Additional US evaluations:  F/U for anatomy at 22.3wks--anatomy visualized and wnl.   Significant prenatal events:  Elevated B/P at 37wks--PIH labs wnl Last evaluation:  12/07/2013--37.5wks--Cervix 2/50/-3  OB History   Grav Para Term Preterm Abortions TAB SAB Ect Mult Living   1              Past Medical History  Diagnosis Date  . ADD (attention deficit disorder)   . Seasonal allergies    Past Surgical History  Procedure Laterality Date  . Wisdom tooth extraction  age 28  . Elbow surgery Right age 89    fracture of right elbow with ORIF with removval of pins later  . Elbow surgery     Family History: family history includes Diabetes in her maternal grandmother; Heart failure in her maternal grandfather and paternal grandfather; Hyperlipidemia in her father; Hypertension in her father and mother; Osteoarthritis in her mother. Social History:  reports that she has never smoked. She has never used smokeless tobacco. She reports that she drinks about 0.5 ounces of alcohol per week. She reports that she does not use illicit drugs.   Prenatal Transfer Tool  Maternal Diabetes: No Genetic Screening: Normal Maternal Ultrasounds/Referrals:  Normal Fetal Ultrasounds or other Referrals:  None Maternal Substance Abuse:  No Significant Maternal Medications:  None Significant Maternal Lab Results: Lab values include: Group B Strep positive, Rh negative, Other: PIH 37.4 wks WNL    ROS:  See HPI Above  No Known Allergies     Blood pressure 146/92, pulse 96, temperature 98.5 F (36.9 C), temperature source Oral, resp. rate 18, height 5\' 1"  (1.549 m), weight 163 lb (73.936 kg), last menstrual period 02/21/2013.  Chest clear Heart RRR without murmur Abd gravid, NT Pelvic: 2/50/-2 Ext: WNL  FHR: 135 UCs:  Irregular, mild  Prenatal labs: ABO, Rh: A/Negative/-- (08/27 0000) Antibody: Negative (08/27 0000) Rubella:   Immune RPR: Nonreactive (08/27 0000)  HBsAg: Negative (08/27 0000)  HIV: Non-reactive (08/27 0000)  GBS:  Posiitve Sickle cell/Hgb electrophoresis:  N/A Pap:  10/2011 WNL  GC:  Negative Chlamydia:  Negative Genetic screenings:  Normal Glucola:  1hr-Abnormal, 3hr-Normal Other:  12/06/13 PIH labs WNL  Assessment IUP at 38.4wks Cat I FT PROM x 9hrs GBS Positive Elevated B/P Inadequate Contraction Pattern  Plan: [facility] consult with Dr. Lance MorinA. Roberts Routine Labor and Delivery Orders Start PCN for GBS prophylaxis [facility] Start Pitocin augmentation  Khyleigh Furney LYNN, CNM, MSN 12/13/2013, 2:30 PM

## 2013-12-14 LAB — CBC
HCT: 32.7 % — ABNORMAL LOW (ref 36.0–46.0)
Hemoglobin: 11.2 g/dL — ABNORMAL LOW (ref 12.0–15.0)
MCH: 29.9 pg (ref 26.0–34.0)
MCHC: 34.3 g/dL (ref 30.0–36.0)
MCV: 87.2 fL (ref 78.0–100.0)
Platelets: 212 10*3/uL (ref 150–400)
RBC: 3.75 MIL/uL — ABNORMAL LOW (ref 3.87–5.11)
RDW: 13.7 % (ref 11.5–15.5)
WBC: 28.5 10*3/uL — ABNORMAL HIGH (ref 4.0–10.5)

## 2013-12-14 NOTE — Lactation Note (Signed)
This note was copied from the chart of Jessica Rosario. Lactation ConsuJolyn Napltation Note Needed assistance in positioning baby. Noted feeding in cradle position. Assisted in football position for deeper latch. Rooting taking to breast well. Basic tips to feeding. Mom has small nipples rolls well, states she doesn't know what happen the that they have changed, her nipple uses to stick out more before delivery. Discussed possible build of fluid.  Patient Name: Jessica Jessica Rosario UJWJX'BToday's Date: 12/14/2013 WH/LC brochure given w/resources, support groups and LC services.Referred to Baby and Me Book in Breastfeeding section Pg. 22-23 for position options and Proper latch demonstration.Encouraged to call for assistance if needed and to verify proper latch.     Maternal Data    Feeding Feeding Type: Breast Fed Length of feed: 15 min  LATCH Score/Interventions                      Lactation Tools Discussed/Used     Consult Status      Charyl DancerLaura G Merrill Villarruel 12/14/2013, 6:18 AM

## 2013-12-14 NOTE — Progress Notes (Addendum)
.   Jessica ShirtsKristin M Rimmer  Post Partum Day 1:S/P SVD with medial episiotomy  Subjective: Patient up ad lib, denies syncope or dizziness. Reports bleeding as minimal.  Pain managed with motrin. Feeding:  Breast Contraceptive plan:   Unsure, requests information  Objective: Blood pressure 115/77, pulse 86, temperature 97.8 F (36.6 C), temperature source Oral, resp. rate 16, height 5\' 1"  (1.549 m), weight 163 lb (73.936 kg), last menstrual period 02/21/2013, SpO2 100.00%, unknown if currently breastfeeding.  Physical Exam:  General: alert, cooperative and no distress Lochia: appropriate Uterine Fundus: firm Incision: N/A Episiotomy: Healing well, minimal swelling at perineum  DVT Evaluation: No evidence of DVT seen on physical exam. Negative Homan's sign. No significant calf/ankle edema.   Recent Labs  12/13/13 1432 12/14/13 0625  HGB 12.1 11.2*  HCT 35.8* 32.7*    Assessment S/P Vaginal delivery day 1 Normal Involution Episiotomy Breastfeeding rH Negative  Plan: Continue current care Rhogam, now, if indicated Infant to have inpatient circumsion Dr. Audree CamelN. Dillard updated on patient status Discharge home tomorrow   Marlene BastJessica Lynn Lam Mccubbins, CNM, MSN 12/14/2013, 9:05 AM

## 2013-12-15 MED ORDER — IBUPROFEN 600 MG PO TABS: 600.0000 mg | ORAL_TABLET | Freq: Four times a day (QID) | ORAL | Status: DC | PRN

## 2013-12-15 NOTE — Lactation Note (Signed)
This note was copied from the chart of Jessica Jolyn NapKristin Ambrocio. Lactation Consultation Note Noted 7%weight loss. Poor feeder at times. Went 5 hrs. W/o feeding, attempted but wouldn't latch. Mom pre-pumped to bring nipples out, has NS but wanted to put baby to breast. Stated he fed well at MN on breast. Mom filling, pumped 9 ml colostrum. Wouldn't latch onto breast. Not interested. W/gloved finger and syring, suck training and fed 9ml of colostrum to baby. Has high palate and recessed chin. Biting on finger, not feeling tongue pulling on finger as feeds. I explained the NS may be beneficial to help baby get a better "transfer" of milk. Demonstrated if the baby isn't getting the nipple far enough back in the mouth and if the tongue isn't pulling on the nipple there will be less of "transfer" of colostrum, therefore resulting in weight loss and sore nipples. Mom's breast are filling. Mom feeding baby to Rt. Breast more than Lt. Stating he doesn't like the Lt. Breast as much. Encouraged to alternate, ask for help if needed in latching to Lt. Breast. Nipples semi flat, rolls well and is compressible that why mom wanted baby to the breast w/o NS. Encouraged to wear Shells when she gets up today to help nipples evert. Pre-pump to "prime the line" also use the NS for a deeper latch.  Patient Name: Jessica Rosario Encouraged to call for assistance if needed and to verify proper latch. Today's Date: 12/15/2013 Reason for consult: Follow-up assessment;Infant weight loss;Infant < 6lbs;Difficult latch   Maternal Data Does the patient have breastfeeding experience prior to this delivery?: No  Feeding Feeding Type: Breast Milk  LATCH Score/Interventions Latch: Too sleepy or reluctant, no latch achieved, no sucking elicited. Intervention(s): Skin to skin;Teach feeding cues;Waking techniques Intervention(s): Adjust position;Assist with latch;Breast massage;Breast compression  Audible Swallowing: None Intervention(s):  Skin to skin;Hand expression Intervention(s): Skin to skin;Hand expression;Alternate breast massage  Type of Nipple: Flat (semi flat) Intervention(s): Double electric pump  Comfort (Breast/Nipple): Filling, red/small blisters or bruises, mild/mod discomfort  Problem noted: Filling Interventions (Filling): Massage;Reverse pressure;Double electric pump Interventions (Mild/moderate discomfort): Pre-pump if needed;Breast shields;Hand massage;Hand expression  Hold (Positioning): Assistance needed to correctly position infant at breast and maintain latch. Intervention(s): Breastfeeding basics reviewed;Support Pillows;Position options;Skin to skin  LATCH Score: 3  Lactation Tools Discussed/Used Tools: Shells;Nipple Dorris CarnesShields;Pump Nipple shield size: 20 (not using/but encouraged) Shell Type: Inverted (not wearing at this moment) Breast pump type: Double-Electric Breast Pump   Consult Status Date: 12/15/13 Follow-up type: In-patient    Jessica Rosario 12/15/2013, 6:12 AM

## 2013-12-15 NOTE — Discharge Summary (Signed)
  Vaginal Delivery Discharge Summary  Jessica Rosario  DOB:    06/12/1986 MRN:    [id number] CSN:    [id number]  Date of admission:                  12/13/13  Date of discharge:                   12/15/13  Procedures this admission:   SVB, MLE per Dr. Richardson Doppole  Date of Delivery: 12/13/13  Newborn Data:  Live born female  Birth Weight: 5 lb 12.6 oz (2625 g) APGAR: 7, 9  Home with mother. Circumcision Plan: Outpatient  History of Present Illness:  Jessica Rosario is a 28 y.o. female, G1P1001, who presents at 7512w4d weeks gestation. [facility] She was admitted onset of labor. Her pregnancy has been complicated by:  Patient Active Problem List   Diagnosis Date Noted  . NSVD (normal spontaneous vaginal delivery) 12/13/2013     Hospital Course:  Admitted 12/13/13. Positive GBS.  Had planned birth tub use, but BP was initially elevated, precluding use initially.  PIH w/u was negative, and BP normalized.Marland Kitchen. Utilized Fentanyl for pain management.  Delivery was performed by Haroldine LawsJennifer Oxley without complication, after MLE performed by Dr. Richardson Doppole due to persistent variable decels. Patient and baby tolerated the procedure without difficulty, with no additional laceration noted. Infant status was stable and remained in room with mother.  Mother and infant then had an uncomplicated postpartum course, with breast feeding going well. Mom's physical exam was WNL, and she was discharged home in stable condition. Contraception plan was undecided.  She received adequate benefit from po pain medications.   Feeding:  breast  Contraception:  Undecided.  Discharge hemoglobin:  Hemoglobin  Date Value Ref Range Status  12/14/2013 11.2* 12.0 - 15.0 g/dL Final  7/82/95628/27/2014 13.013.0   Final     HCT  Date Value Ref Range Status  12/14/2013 32.7* 36.0 - 46.0 % Final  05/04/2013 38   Final    Discharge Physical  Exam:   General: alert Lochia: appropriate Uterine Fundus: firm Incision: healing well DVT Evaluation: No evidence of DVT seen on physical exam. Negative Homan's sign.  Intrapartum Procedures: spontaneous vaginal delivery and MLE Postpartum Procedures: none Complications-Operative and Postpartum: None  Discharge Diagnoses: Term Pregnancy-delivered  Discharge Information:  Activity:           pelvic rest Diet:                routine Medications: Ibuprofen Condition:      stable Instructions:     Discharge to: home  Follow-up Information   Follow up with [facility] In 6 weeks. (Call for any questions or concerns.)    Specialty:  Obstetrics and Gynecology   Contact information:   [address] [place] [phone]       Nigel BridgemanVicki Kemonte Ullman 12/15/2013

## 2013-12-15 NOTE — Discharge Instructions (Signed)

## 2013-12-16 ENCOUNTER — Ambulatory Visit: Payer: Self-pay

## 2013-12-16 NOTE — Lactation Note (Signed)
This note was copied from the chart of Jessica Jolyn NapKristin Seubert. Lactation Consultation Note  Patient Name: Jessica Rosario ZOXWR'UToday's Date: 12/16/2013   Visited with Mom on day of discharge, baby 7359 hrs old.  Mom reports baby breast feeding well at times, and then if he doesn't latch on, she will pump and syringe feed milk to him (volume parameters given).  Pumped colostrum (about 20 ml) at bedside for next feeding.  Mom has Medela PIS for home use.  Mom states she is using the nipple shield sometimes.  Recommended she come for an outpatient appointment next week.  Mom declining wanting help today or after discharge, saying she knows what to do.  Pediatrician came into room, and he agreed it would be good idea to come see Lactation.  Outpatient appointment made for Tuesday, 12/20/13 @ 1pm.  Encouraged continued skin to skin, and cue based feedings, waking him at 3 hrs if he is sleeping.  Weight is <6 lbs, and weight loss at 8%.  Engorgement prevention and treatment discussed.  Reminded Mom of OP Lactation services available.  To call prn.    Judee ClaraCaroline E Jarrad Mclees 12/16/2013, 8:54 AM

## 2013-12-28 ENCOUNTER — Ambulatory Visit: Payer: Self-pay

## 2013-12-28 NOTE — Lactation Note (Signed)
This note was copied from the chart of Jessica Rosario. Infant Lactation Consultation Outpatient Visit Note  Patient Name: Jessica Rosario Date of Birth: 12/13/2012 Birth Weight:  5 lb 12.6 oz (2625 g) Gestational Age at Delivery: Gestational Age: 5936w4d Type of Delivery: SVB  Breastfeeding History Frequency of Breastfeeding: 8 times per day, usually 1 breast per feeding.  Length of Feeding: 10 minutes Voids:   7-8/day Stools:  6-7/day, yellow/seedy  Supplementing / Method: Pumping:  Type of Pump:  Medela PNS   Frequency:  After every feeding for 10-15 minutes  Volume:  3 1/2-4 oz combined.  Comments: Mom is here for follow up visit from 12/20/12 for weight check and feeding assessment. Baby has been slow to gain weight, is now 662 weeks old and not back to birth weight. Mom is supplementing after each feeding with 30 ml of EBM via bottle. Mom reports baby's pattern is to nurse, then he will take the supplement about 45 minutes after BF. She is getting a little overwhelmed with feeding/supplementing and pumping. Mom is using #20 nipple shield to latch baby and reports he is still sleepy at the breast.    Consultation Evaluation:  Initial Feeding Assessment: Pre-feed Weight:    5 lb. 9.6 oz/2540 gm Post-feed Weight:   5 lb. 10.8 oz/2574 gm Amount Transferred:  34 ml  Comments:  From left breast with nursing for 20 minutes. Used #20 nipple shield to latch baby, lots of breast milk visible in the nipple shield. Baby becomes sleepy after about 10 minutes. Demonstrated to Mom how to stimulate baby to stay awake. Encouraged Mom to support breast and massage with nursing to increase milk transfer.   Additional Feeding Assessment: Pre-feed Weight:   5 lb. 10.8 oz/2574 gm Post-feed Weight:   5 lb. 11.6 oz/2596 gm Amount Transferred:  22 ml.  Comments:  From right breast with nursing for 14 minutes using #20 nipple shield to latch. Again, baby sleepy at breast. Worked with Mom on positioning,  supporting breast and how to massage breast to empty breast and keep baby nursing.   Additional Feeding Assessment: Pre-feed Weight: Post-feed Weight: Amount Transferred: Comments:  Total Breast milk Transferred this Visit: 56 ml.  Total Supplement Given: None baby satiated.  Additional Interventions: Baby transferred at total of 24 ml last week and transferred 56 ml this week indicating an improvement. Advised Mom to Pre-pump each feeding to get milk flow going for baby and to remove foremilk so when baby nurses he will get higher fat content milk till he is back to birth weight and nursing consistently both breasts. BF every 2-3 hours, try to keep baby active at the breast for 15-20 minutes, offer both breasts each feeding. If baby nursing for 15-20 minutes both breasts, then try to supplement after feedings up to 30 ml till he is back to birth weight. Would like to be sure he is getting between 60-90 ml each feeding.  If baby only nursing on 1 breast per feeding, then increase supplements to 45 ml after feedings till back to birth weight. Pump 4-6 times per day both breasts, however if baby only BF on 1 breast at a feeding, then pump the other breast to protect milk supply.  Try to just BF at night.  Continue to use nipple shield to help with latch till baby more active at breast and back to birth weight. Encouraged Mom that baby is showing improvement and once his weight gets on track he will have more  energy to be a better breast feeder.   Follow-Up Mom plans to come to support group next week for weight check. Encouraged to come Monday, 7pm to support group.  May schedule another OP follow up appointment at that time.      Kearney HardKathy Ann Jillana Selph 12/28/2013, 6:48 PM

## 2014-03-13 ENCOUNTER — Ambulatory Visit: Payer: BC Managed Care – PPO | Admitting: Nurse Practitioner

## 2014-07-10 ENCOUNTER — Encounter (HOSPITAL_COMMUNITY): Payer: Self-pay | Admitting: *Deleted

## 2017-03-12 LAB — OB RESULTS CONSOLE GC/CHLAMYDIA
Chlamydia: NEGATIVE
Gonorrhea: NEGATIVE

## 2017-03-12 LAB — OB RESULTS CONSOLE HIV ANTIBODY (ROUTINE TESTING): HIV: NONREACTIVE

## 2017-03-12 LAB — OB RESULTS CONSOLE ANTIBODY SCREEN: ANTIBODY SCREEN: NEGATIVE

## 2017-03-12 LAB — OB RESULTS CONSOLE ABO/RH: RH TYPE: NEGATIVE

## 2017-03-12 LAB — OB RESULTS CONSOLE RUBELLA ANTIBODY, IGM: Rubella: IMMUNE

## 2017-03-12 LAB — OB RESULTS CONSOLE HEPATITIS B SURFACE ANTIGEN: HEP B S AG: NEGATIVE

## 2017-03-12 LAB — OB RESULTS CONSOLE RPR: RPR: NONREACTIVE

## 2017-09-08 NOTE — L&D Delivery Note (Addendum)
Delivery Note At 5:47 PM a  viable female "Jessica Rosario" was delivered via  (Presentation:LOA ).  APGAR: 9,9 ; weight  Pending.   Placenta status: Normal appearing, grossly intact. Cord: nuchal cord easily reduced, 3 vessel cord.   Anesthesia:  Nitrous oxide, lidocaine injection.  Episiotomy:  Npne.  Lacerations:  1st degree and superficial.  Suture Repair: 3.0 vicryl Est. Blood Loss (mL):  200cc.  Mom to postpartum.  Baby to Couplet care / Skin to Skin.  Konrad FelixKULWA,Keyaria Lawson WAKURU, MD.  11/05/2017, 6:28 PM

## 2017-09-10 ENCOUNTER — Encounter (HOSPITAL_COMMUNITY): Payer: Self-pay | Admitting: *Deleted

## 2017-09-10 ENCOUNTER — Inpatient Hospital Stay (HOSPITAL_COMMUNITY): Payer: BC Managed Care – PPO

## 2017-09-10 ENCOUNTER — Other Ambulatory Visit: Payer: Self-pay

## 2017-09-10 ENCOUNTER — Inpatient Hospital Stay (HOSPITAL_COMMUNITY)
Admission: AD | Admit: 2017-09-10 | Discharge: 2017-09-10 | Disposition: A | Discharge status: Home / Self Care | Payer: BC Managed Care – PPO | Source: Ambulatory Visit | Attending: Obstetrics and Gynecology | Admitting: Obstetrics and Gynecology

## 2017-09-10 DIAGNOSIS — O368120 Decreased fetal movements, second trimester, not applicable or unspecified: Secondary | ICD-10-CM

## 2017-09-10 DIAGNOSIS — Z3A32 32 weeks gestation of pregnancy: Secondary | ICD-10-CM | POA: Insufficient documentation

## 2017-09-10 DIAGNOSIS — O368130 Decreased fetal movements, third trimester, not applicable or unspecified: Secondary | ICD-10-CM | POA: Diagnosis present

## 2017-09-10 NOTE — Discharge Instructions (Signed)

## 2017-09-10 NOTE — MAU Provider Note (Signed)
  History     CSN: [id number]  Arrival date and time: 09/10/17 [id number]   First Provider Initiated Contact with Patient 09/10/17 1152      Chief Complaint  Patient presents with  . Decreased Fetal Movement  . Fetal Monitoring   HPI  Pt sent here from the office for decreased fetal movement and low base line on NST    Past Medical History:  Diagnosis Date  . ADD (attention deficit disorder)   . Seasonal allergies     Past Surgical History:  Procedure Laterality Date  . ELBOW SURGERY Right age 89   fracture of right elbow with ORIF with removval of pins later  . ELBOW SURGERY    . WISDOM TOOTH EXTRACTION  age 32    Family History  Problem Relation Age of Onset  . Hypertension Mother   . Osteoarthritis Mother   . Hypertension Father   . Hyperlipidemia Father   . Heart failure Maternal Grandfather   . Diabetes Maternal Grandmother   . Heart failure Paternal Grandfather     Social History   Tobacco Use  . Smoking status: Never Smoker  . Smokeless tobacco: Never Used  Substance Use Topics  . Alcohol use: Yes    Alcohol/week: 0.5 oz    Types: 1 Standard drinks or equivalent per week  . Drug use: No    Allergies: No Known Allergies  No medications prior to admission.    Review of Systems Physical Exam   Blood pressure 109/76, pulse 66, temperature 98 F (36.7 C), temperature source Oral, resp. rate 18, height 5\' 1"  (1.549 m), weight 62 kg (136 lb 12 oz), SpO2 100 %, not currently breastfeeding.  Physical Exam  Physical Examination: General appearance - alert, well appearing, and in no distress Chest - clear to auscultation, no wheezes, rales or rhonchi, symmetric air entry Heart - normal rate and regular rhythm Abdomen - soft, nontender, nondistended, no masses or organomegaly gravid Extremities - Homan's sign negative bilaterally  MAU Course  Procedures  MDM   Assessment and Plan  BPP 8/8 NST 130s and reactive DC home with fetal kick count  instructions  Raheim Beutler A 09/10/2017, 1:17 PM

## 2017-09-10 NOTE — MAU Note (Signed)
Pt sent to MAU for prolonged EFM secondary decreased FM and variable decels on FHR tracing @ office visit.

## 2017-10-30 ENCOUNTER — Telehealth (HOSPITAL_COMMUNITY): Payer: Self-pay | Admitting: *Deleted

## 2017-10-30 NOTE — Telephone Encounter (Signed)
Preadmission screen  

## 2017-11-02 ENCOUNTER — Encounter (HOSPITAL_COMMUNITY): Payer: Self-pay | Admitting: *Deleted

## 2017-11-05 ENCOUNTER — Inpatient Hospital Stay (HOSPITAL_COMMUNITY)
Admission: AD | Admit: 2017-11-05 | Discharge: 2017-11-07 | DRG: 807 | Disposition: A | Discharge status: Home / Self Care | Payer: BC Managed Care – PPO | Source: Ambulatory Visit | Attending: Obstetrics & Gynecology | Admitting: Obstetrics & Gynecology

## 2017-11-05 ENCOUNTER — Encounter (HOSPITAL_COMMUNITY): Payer: Self-pay | Admitting: *Deleted

## 2017-11-05 DIAGNOSIS — O479 False labor, unspecified: Secondary | ICD-10-CM | POA: Diagnosis present

## 2017-11-05 DIAGNOSIS — Z3483 Encounter for supervision of other normal pregnancy, third trimester: Secondary | ICD-10-CM | POA: Diagnosis present

## 2017-11-05 DIAGNOSIS — Z3A40 40 weeks gestation of pregnancy: Secondary | ICD-10-CM | POA: Diagnosis not present

## 2017-11-05 DIAGNOSIS — O99824 Streptococcus B carrier state complicating childbirth: Principal | ICD-10-CM | POA: Diagnosis present

## 2017-11-05 LAB — CBC
HCT: 40.2 % (ref 36.0–46.0)
Hemoglobin: 13.7 g/dL (ref 12.0–15.0)
MCH: 30 pg (ref 26.0–34.0)
MCHC: 34.1 g/dL (ref 30.0–36.0)
MCV: 88.2 fL (ref 78.0–100.0)
PLATELETS: 203 10*3/uL (ref 150–400)
RBC: 4.56 MIL/uL (ref 3.87–5.11)
RDW: 13.4 % (ref 11.5–15.5)
WBC: 14.6 10*3/uL — ABNORMAL HIGH (ref 4.0–10.5)

## 2017-11-05 MED ORDER — SENNOSIDES-DOCUSATE SODIUM 8.6-50 MG PO TABS
2.0000 | ORAL_TABLET | ORAL | Status: DC
  Administered 2017-11-06 (×2): 2 via ORAL
  Filled 2017-11-05 (×2): qty 2

## 2017-11-05 MED ORDER — PENICILLIN G POTASSIUM 5000000 UNITS IJ SOLR
5.0000 10*6.[IU] | Freq: Once | INTRAMUSCULAR | Status: AC
  Administered 2017-11-05: 5 10*6.[IU] via INTRAVENOUS
  Filled 2017-11-05: qty 5

## 2017-11-05 MED ORDER — PRENATAL MULTIVITAMIN CH
1.0000 | ORAL_TABLET | Freq: Every day | ORAL | Status: DC
  Administered 2017-11-06 – 2017-11-07 (×2): 1 via ORAL
  Filled 2017-11-05 (×2): qty 1

## 2017-11-05 MED ORDER — PENICILLIN G POT IN DEXTROSE 60000 UNIT/ML IV SOLN
3.0000 10*6.[IU] | INTRAVENOUS | Status: DC
  Filled 2017-11-05 (×2): qty 50

## 2017-11-05 MED ORDER — OXYTOCIN 10 UNIT/ML IJ SOLN
INTRAMUSCULAR | Status: AC
  Administered 2017-11-05: 10 [IU]
  Filled 2017-11-05: qty 1

## 2017-11-05 MED ORDER — TETANUS-DIPHTH-ACELL PERTUSSIS 5-2.5-18.5 LF-MCG/0.5 IM SUSP: 0.5000 mL | Freq: Once | INTRAMUSCULAR | Status: DC

## 2017-11-05 MED ORDER — ONDANSETRON HCL 4 MG PO TABS: 4.0000 mg | ORAL_TABLET | ORAL | Status: DC | PRN

## 2017-11-05 MED ORDER — LACTATED RINGERS IV SOLN: 500.0000 mL | INTRAVENOUS | Status: DC | PRN

## 2017-11-05 MED ORDER — SIMETHICONE 80 MG PO CHEW: 80.0000 mg | CHEWABLE_TABLET | ORAL | Status: DC | PRN

## 2017-11-05 MED ORDER — ACETAMINOPHEN 325 MG PO TABS: 650.0000 mg | ORAL_TABLET | ORAL | Status: DC | PRN

## 2017-11-05 MED ORDER — OXYTOCIN BOLUS FROM INFUSION: 500.0000 mL | Freq: Once | INTRAVENOUS | Status: DC

## 2017-11-05 MED ORDER — ZOLPIDEM TARTRATE 5 MG PO TABS: 5.0000 mg | ORAL_TABLET | Freq: Every evening | ORAL | Status: DC | PRN

## 2017-11-05 MED ORDER — WITCH HAZEL-GLYCERIN EX PADS: 1.0000 "application " | MEDICATED_PAD | CUTANEOUS | Status: DC | PRN

## 2017-11-05 MED ORDER — BENZOCAINE-MENTHOL 20-0.5 % EX AERO
1.0000 "application " | INHALATION_SPRAY | CUTANEOUS | Status: DC | PRN
  Administered 2017-11-06: 1 via TOPICAL
  Filled 2017-11-05: qty 56

## 2017-11-05 MED ORDER — DIPHENHYDRAMINE HCL 25 MG PO CAPS: 25.0000 mg | ORAL_CAPSULE | Freq: Four times a day (QID) | ORAL | Status: DC | PRN

## 2017-11-05 MED ORDER — IBUPROFEN 600 MG PO TABS
600.0000 mg | ORAL_TABLET | Freq: Four times a day (QID) | ORAL | Status: DC
  Administered 2017-11-06 – 2017-11-07 (×8): 600 mg via ORAL
  Filled 2017-11-05 (×8): qty 1

## 2017-11-05 MED ORDER — LACTATED RINGERS IV SOLN
INTRAVENOUS | Status: DC
  Administered 2017-11-05: 125 mL/h via INTRAVENOUS

## 2017-11-05 MED ORDER — ONDANSETRON HCL 4 MG/2ML IJ SOLN: 4.0000 mg | INTRAMUSCULAR | Status: DC | PRN

## 2017-11-05 MED ORDER — LIDOCAINE HCL (PF) 1 % IJ SOLN
30.0000 mL | INTRAMUSCULAR | Status: AC | PRN
  Administered 2017-11-05: 30 mL via SUBCUTANEOUS
  Filled 2017-11-05: qty 30

## 2017-11-05 MED ORDER — ONDANSETRON HCL 4 MG/2ML IJ SOLN
4.0000 mg | Freq: Four times a day (QID) | INTRAMUSCULAR | Status: DC | PRN
Start: 2017-11-05 — End: 2017-11-05

## 2017-11-05 MED ORDER — ACETAMINOPHEN 325 MG PO TABS
650.0000 mg | ORAL_TABLET | ORAL | Status: DC | PRN
Start: 2017-11-05 — End: 2017-11-05

## 2017-11-05 MED ORDER — SOD CITRATE-CITRIC ACID 500-334 MG/5ML PO SOLN: 30.0000 mL | ORAL | Status: DC | PRN

## 2017-11-05 MED ORDER — OXYTOCIN 40 UNITS IN LACTATED RINGERS INFUSION - SIMPLE MED
2.5000 [IU]/h | INTRAVENOUS | Status: DC
  Filled 2017-11-05: qty 1000

## 2017-11-05 MED ORDER — DIBUCAINE 1 % RE OINT: 1.0000 "application " | TOPICAL_OINTMENT | RECTAL | Status: DC | PRN

## 2017-11-05 MED ORDER — COCONUT OIL OIL: 1.0000 "application " | TOPICAL_OIL | Status: DC | PRN

## 2017-11-05 NOTE — Plan of Care (Signed)
Progressing appropriately. Encouraged to call for assistance with feeding as needed, and for LATCH assessment. 

## 2017-11-05 NOTE — H&P (Addendum)
Genia DelKristin M Link is a 32 y.o. female presenting for contractions in labor. She denies vaginal bleeding or leakage of fluid.  With normal gross fetal movement. G2P1001 @ 40 weeks 2 days EGA.  LMP 01/27/17, EDC 11/03/17 by LMP c/w first trimester US.  Patient was checked in office today and found to be 4cm dilated, and was sent to Labor and delivery as a direct admit.   OB History    Gravida Para Term Preterm AB Living   2 1 1  0 0 1   SAB TAB Ectopic Multiple Live Births   0 0 0 0 1     Past Medical History:  Diagnosis Date  . ADD (attention deficit disorder)   . Seasonal allergies    Past Surgical History:  Procedure Laterality Date  . ELBOW SURGERY Right age 89   fracture of right elbow with ORIF with removval of pins later  . ELBOW SURGERY    . WISDOM TOOTH EXTRACTION  age 32   Family History: family history includes Diabetes in her maternal grandmother; Heart failure in her maternal grandfather and paternal grandfather; Hyperlipidemia in her father; Hypertension in her father and mother; Osteoarthritis in her mother. Social History:  reports that  has never smoked. she has never used smokeless tobacco. She reports that she drinks about 0.5 oz of alcohol per week. She reports that she does not use drugs.     Maternal Diabetes: No Genetic Screening: Normal Maternal Ultrasounds/Referrals: Normal Fetal Ultrasounds or other Referrals:  None Maternal Substance Abuse:  No Significant Maternal Medications:  Meds include: Other:  Rhogam at [redacted] weeks EGA.  Significant Maternal Lab Results:  None Other Comments:  None  ROS History Constitutional: Denies fevers/chills Cardiovascular: Denies chest pain or palpitations Pulmonary: Denies coughing or wheezing Gastrointestinal: Denies nausea, vomiting or diarrhea Genitourinary: Denies pelvic pain, unusual vaginal bleeding, unusual vaginal discharge, dysuria, urgency or frequency.  Musculoskeletal: Denies muscle or joint aches and pain.   Neurology: Denies abnormal sensations such as tingling or numbness.   Height 5\' 1"  (1.549 m), weight 65.3 kg (143 lb 14.4 oz).  Blood pressure 126/84, pulse (!) 244, resp. rate 16, height 5\' 1"  (1.549 m), weight 65.3 kg (143 lb 14.4 oz). Exam Physical Exam  Physical Exam  Vitals reviewed. Constitutional: She is oriented to person, place, and time. She appears well-developed and well-nourished.  HENT:  Head: Normocephalic and atraumatic.  Eyes: Conjunctivae and EOM are normal.  Neck: Normal range of motion. Neck supple.  Cardiovascular: Normal rate.  Respiratory: Effort normal and breath sounds normal.  GI: Soft. She exhibits no mass. There is no tenderness.  Genitourinary: Gravid uterus, appropriate size for gestational age.  Musculoskeletal: Normal range of motion.  Neurological: She is alert and oriented to person, place, and time.  Skin: Skin is warm and dry.  Psychiatric: She has a normal mood and affect. Judgment normal.   Prenatal labs: ABO, Rh: A/Negative/-- (07/05 0000) Antibody: Negative (07/05 0000) Rubella: Immune (07/05 0000) RPR: Nonreactive (07/05 0000)  HBsAg: Negative (07/05 0000)  HIV: Non-reactive (07/05 0000)  GBS:   Positive.   Assessment/Plan 32 y/o G2P1 @ 40 weeks 2 days EGA in labor, Admit to labor and delivery. Anticipate NSVD.   PCN for GBS prophylaxis.   Konrad FelixKULWA,Ivie Maese WAKURU , MD.  11/05/2017, 5:22 PM

## 2017-11-06 ENCOUNTER — Encounter (HOSPITAL_COMMUNITY): Payer: Self-pay | Admitting: *Deleted

## 2017-11-06 LAB — CBC
HEMATOCRIT: 37.2 % (ref 36.0–46.0)
Hemoglobin: 12.8 g/dL (ref 12.0–15.0)
MCH: 30.5 pg (ref 26.0–34.0)
MCHC: 34.4 g/dL (ref 30.0–36.0)
MCV: 88.6 fL (ref 78.0–100.0)
Platelets: 200 10*3/uL (ref 150–400)
RBC: 4.2 MIL/uL (ref 3.87–5.11)
RDW: 13.6 % (ref 11.5–15.5)
WBC: 19.6 10*3/uL — AB (ref 4.0–10.5)

## 2017-11-06 LAB — RPR: RPR Ser Ql: NONREACTIVE

## 2017-11-06 MED ORDER — RHO D IMMUNE GLOBULIN 1500 UNIT/2ML IJ SOSY
300.0000 ug | PREFILLED_SYRINGE | Freq: Once | INTRAMUSCULAR | Status: AC
  Administered 2017-11-06: 300 ug via INTRAMUSCULAR
  Filled 2017-11-06: qty 2

## 2017-11-06 NOTE — Plan of Care (Signed)
Progressing appropriately. Safety information and paperwork gone over. Encouraged to call for assistance as needed, and for [facility] assessment.

## 2017-11-06 NOTE — Lactation Note (Signed)
This note was copied from a baby's chart. Lactation Consultation Note Baby 8 hrs old. Mom stated baby had fed well earlier, has slept well since 10:30 pm, hasn't woke baby to feed. Discussed newborn feeding habits, behavior, STS, I&O, cluster feeding, supply and demand. Encouraged if baby hasn't woke or cued in 3 hrs to wake baby to feed. Mom BF her now 89 yr old for 2 yrs. Mom had to use a NS for about 3-4 months. Mom has short shaft everted small nipples. Hand expressed easy flow of colostrum.  Discussed positioning and support. Encouraged mom to ask for tools for assistance if needed. Mom stated she felt like it was going well so far and would call if needed anything. Mom encouraged to feed baby 8-12 times/24 hours and with feeding cues.\  [facility] brochure given w/resources, support groups and LC services.  Patient Name: Jessica Jolyn NapKristin Rosario ZOXWR'UToday's Date: 11/06/2017 Reason for consult: Initial assessment   Maternal Data Has patient been taught Hand Expression?: Yes Does the patient have breastfeeding experience prior to this delivery?: Yes  Feeding    LATCH Score       Type of Nipple: Everted at rest and after stimulation(short shaft)  Comfort (Breast/Nipple): Soft / non-tender        Interventions Interventions: Breast feeding basics reviewed;Position options;Support pillows;Breast massage;Hand express  Lactation Tools Discussed/Used     Consult Status Consult Status: Follow-up Date: 11/07/17 Follow-up type: In-patient    Doloros Kwolek, Diamond NickelLAURA G 11/06/2017, 2:20 AM

## 2017-11-07 LAB — RH IG WORKUP (INCLUDES ABO/RH)
ABO/RH(D): A NEG
FETAL SCREEN: NEGATIVE
Gestational Age(Wks): 40.2
UNIT DIVISION: 0

## 2017-11-07 MED ORDER — IBUPROFEN 600 MG PO TABS: 600.0000 mg | ORAL_TABLET | Freq: Four times a day (QID) | ORAL | 0 refills | Status: AC | PRN

## 2017-11-07 NOTE — Lactation Note (Signed)
This note was copied from a baby's chart. Lactation Consultation Note  Patient Name: Jessica Rosario ZOXWR'UToday's Date: 11/07/2017 Reason for consult: Follow-up assessment  5841 hours old female who is being exclusively BF by his mother; she's a P2. Mom and baby are getting discharged today. Mom was concerned about getting sore on left nipple, she believed baby was not getting a deep latch. Observed the feedings and indeed, baby's latch was shallow. Assisted mom with latching and showed her how to achieved a deep latch, baby also did STS. Reviewed BF basics, engorgement prevention and treatment and how to watch for signs of poor intake. Parents are aware of LC services and will call for an OP appt if needed.  Maternal Data    Feeding Feeding Type: Breast Fed Length of feed: 10 min(baby still nursing when exiting the room)  LATCH Score Latch: Grasps breast easily, tongue down, lips flanged, rhythmical sucking.  Audible Swallowing: A few with stimulation  Type of Nipple: Everted at rest and after stimulation  Comfort (Breast/Nipple): Soft / non-tender  Hold (Positioning): Assistance needed to correctly position infant at breast and maintain latch.  LATCH Score: 8  Interventions Interventions: Breast feeding basics reviewed;Assisted with latch;Skin to skin;Breast massage;Breast compression;Support pillows;Position options  Lactation Tools Discussed/Used     Consult Status Consult Status: Complete    Jessica Rosario 11/07/2017, 11:22 AM

## 2017-11-07 NOTE — Discharge Instructions (Signed)

## 2017-11-07 NOTE — Discharge Summary (Signed)
OB Discharge Summary     Patient Name: Jessica Rosario DOB: 11/14/1985 MRN: [id number]  Date of admission: 11/05/2017 Delivering MD: Hoover BrownsKULWA, EMA   Date of discharge: 11/07/2017  Admitting diagnosis: 40WKS LABOR Intrauterine pregnancy: 2915w2d     Secondary diagnosis:  Active Problems:   SVD (spontaneous vaginal delivery)   Uterine contractions during pregnancy  Additional problems: none     Discharge diagnosis: Term Pregnancy Delivered                                                                                                Post partum procedures:rhogam  Augmentation:   Complications: None  Hospital course:  Onset of Labor With Vaginal Delivery     32 y.o. yo [id number] at 1415w2d was admitted in Latent Labor on 11/05/2017. Patient had an uncomplicated labor course as follows:  Membrane Rupture Time/Date: 5:35 PM ,11/05/2017   Intrapartum Procedures: Episiotomy: None [1]                                         Lacerations:  1st degree [2]  Patient had a delivery of a Viable female infant. 11/05/2017  Information for the patient's newborn:  Frutoso SchatzOdom, Boy Klynn [[id number]][[id number]]  Delivery Method: Vag-Spont    Pateint had an uncomplicated postpartum course.  She is ambulating, tolerating a regular diet, passing flatus, and urinating well. Patient is discharged home in stable condition on 11/07/17.   Physical exam  Vitals:   11/05/17 2125 11/06/17 0128 11/06/17 0859 11/06/17 1857  BP: 118/74 119/67 127/85 133/71  Pulse: 65 (!) 56 77 62  Resp: 18 18 20 18   Temp: 98.9 F (37.2 C) 98.3 F (36.8 C) 97.6 F (36.4 C) 97.6 F (36.4 C)  TempSrc: Oral Oral Oral Oral  Weight:      Height:       General: alert, cooperative and no distress Lochia: appropriate Uterine Fundus: firm Incision:  DVT Evaluation: No evidence of DVT seen on physical exam. Labs: Lab Results  Component Value Date   WBC 19.6 (H) 11/06/2017   HGB 12.8 11/06/2017   HCT 37.2 11/06/2017   MCV 88.6 11/06/2017   PLT  200 11/06/2017   CMP Latest Ref Rng & Units 12/13/2013  Glucose 70 - 99 mg/dL 75  BUN 6 - 23 mg/dL 13  Creatinine 2.840.50 - 1.321.10 mg/dL 4.400.66  Sodium 102137 - 725147 mEq/L 137  Potassium 3.7 - 5.3 mEq/L 4.1  Chloride 96 - 112 mEq/L 103  CO2 19 - 32 mEq/L 20  Calcium 8.4 - 10.5 mg/dL 9.9  Total Protein 6.0 - 8.3 g/dL 6.5  Total Bilirubin 0.3 - 1.2 mg/dL <3.6(U<0.2(L)  Alkaline Phos 39 - 117 U/L 121(H)  AST 0 - 37 U/L 14  ALT 0 - 35 U/L 10    Discharge instruction: per After Visit Summary and "Baby and Me Booklet".  After visit meds:    Diet: routine diet  Activity: Advance as tolerated. Pelvic rest for 6 weeks.   Outpatient  follow up:6 weeks Follow up Appt: Future Appointments  Date Time Provider Department Center  11/10/2017  7:00 AM [facility] RO[facility] None   Follow up Visit:No Follow-up on file.  Postpartum contraception: Progesterone only pills  Newborn Data: Live born female  Birth Weight: 6 lb 10.4 oz (3015 g) APGAR: 9, 9  Newborn Delivery   Birth date/time:  11/05/2017 17:47:00 Delivery type:  Vaginal, Spontaneous     Baby Feeding: Breast Disposition:home with mother  Op circ   11/07/2017 Rhea Pink, CNM

## 2017-11-09 LAB — TYPE AND SCREEN
ABO/RH(D): A NEG
ANTIBODY SCREEN: POSITIVE
Unit division: 0
Unit division: 0

## 2017-11-09 LAB — BPAM RBC
Blood Product Expiration Date: 201903252359
Blood Product Expiration Date: 201903252359
Unit Type and Rh: 600
Unit Type and Rh: 600

## 2017-11-10 ENCOUNTER — Inpatient Hospital Stay (HOSPITAL_COMMUNITY): Payer: BC Managed Care – PPO

## 2019-07-05 IMAGING — US US MFM FETAL BPP W/O NON-STRESS
1 series · 15 of 28 positions shown · non-contrast
Comparison: none

1  JAMAICA HILSON            [id number]      [id number]     [id number]
Indications

32 weeks gestation of pregnancy
Decreased fetal movements, thrid trimester,
fetus 1
Fetal heart rate decelerations affecting
management of mother
OB History
Blood Type:            Height:  5'1"   Weight (lb):  136       BMI:
Gravidity:    2         Term:   1        Prem:   0        SAB:   0
TOP:          0       Ectopic:  0        Living: 1
Fetal Evaluation
Num Of Fetuses:     1
Fetal Heart         141
Rate(bpm):
Cardiac Activity:   Observed
Presentation:       Cephalic
Placenta:           Anterior, above cervical os
P. Cord Insertion:  Visualized, central
Amniotic Fluid
AFI FV:      Subjectively within normal limits
AFI Sum(cm)     %Tile       Largest Pocket(cm)
17.67           65
RUQ(cm)       RLQ(cm)       LUQ(cm)        LLQ(cm)
5.9
Comment:    Stomach, bladder, and diaphragm noted.
Biophysical Evaluation
Amniotic F.V:   Within normal limits       F. Tone:        Observed
F. Movement:    Observed                   Score:          [DATE]
F. Breathing:   Observed
Gestational Age
Clinical EDD:  32w 2d                                        EDD:   11/03/17
Best:          32w 2d     Det. By:  Clinical EDD             EDD:   11/03/17
Anatomy
Diaphragm:             Appears normal         Bladder:                Appears normal
Stomach:               Appears normal, left
sided
Impression
INDICATION: 31 yr old [id number] at 91w1d with decreased fetal
movement for BPP. Remote read.

[Series 1: us mfm fetal bpp w/o non-stress · 36 acquisitions, 15 frames shown]
[im 1/36]
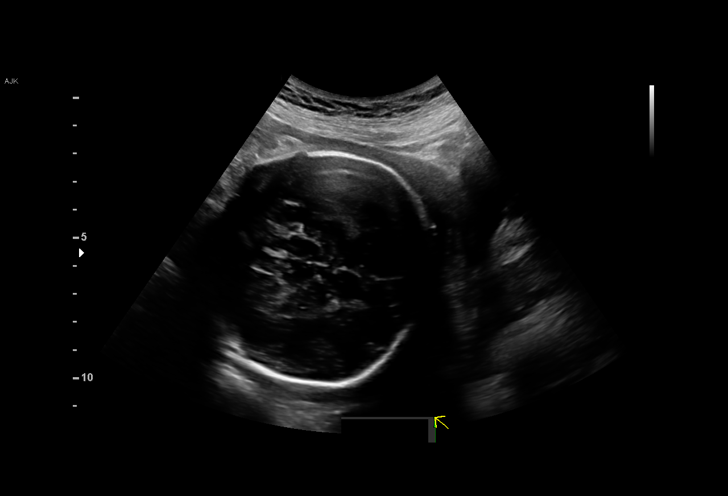
[im 3/36]
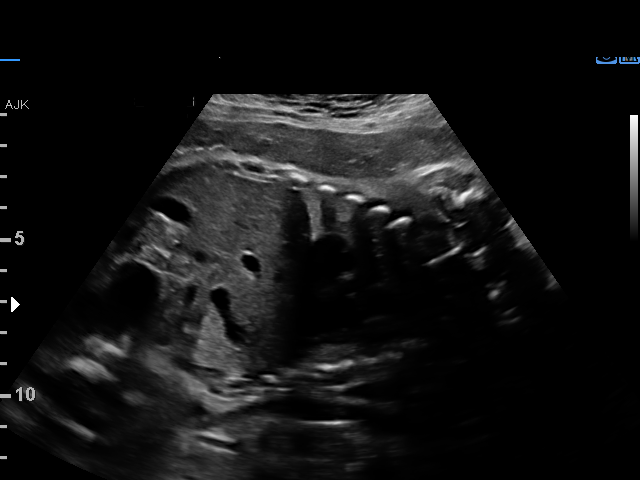
[im 6/36]
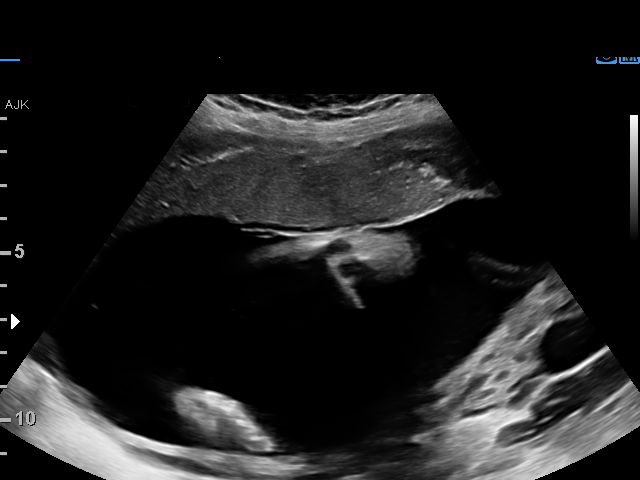
[im 8/36]
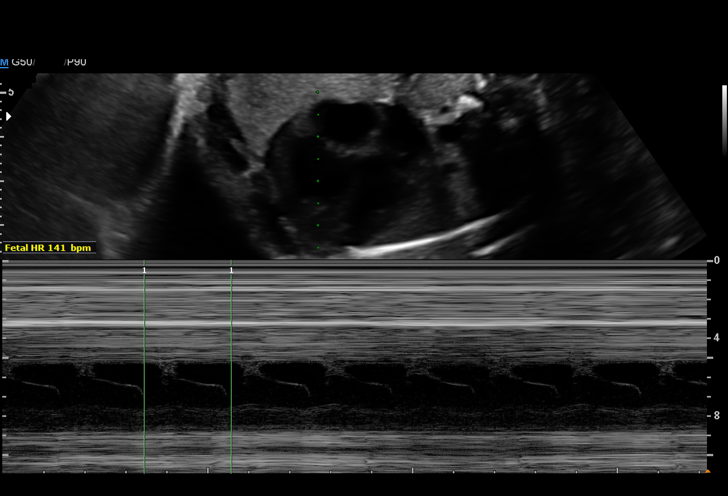
[im 11/36]
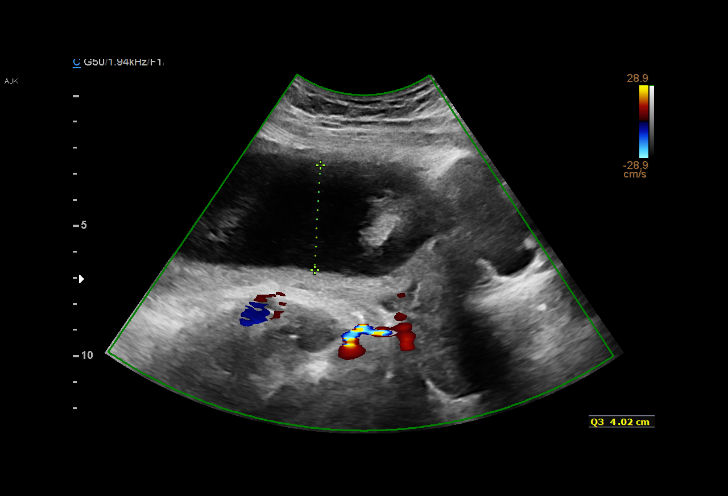
[im 13/36]
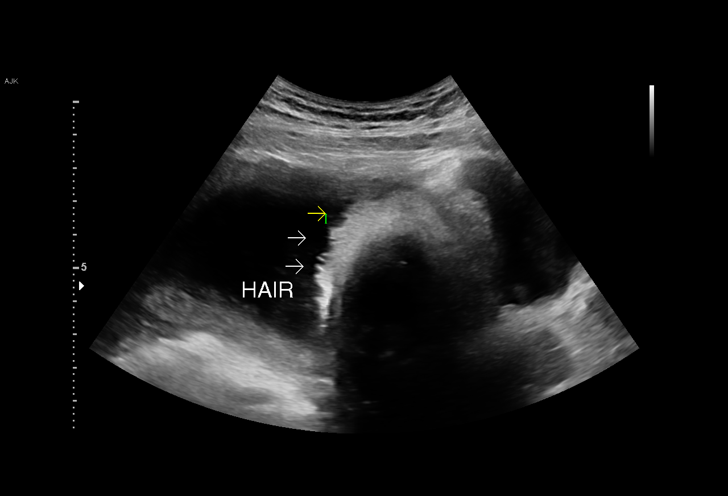
[im 16/36]
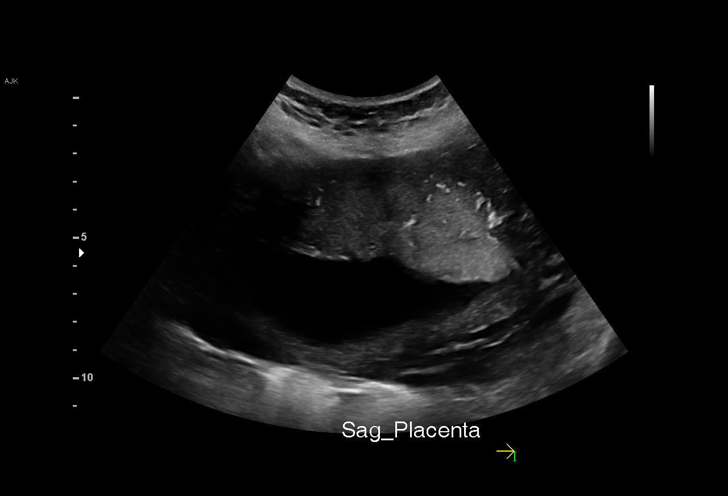
[im 19/36]
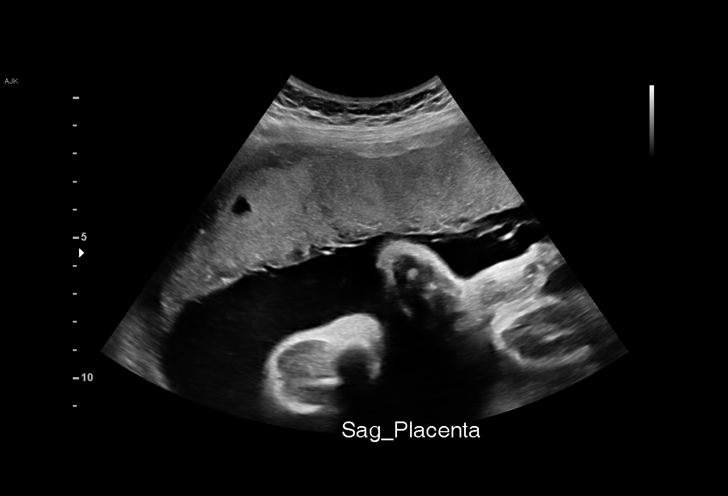
[im 20/36]
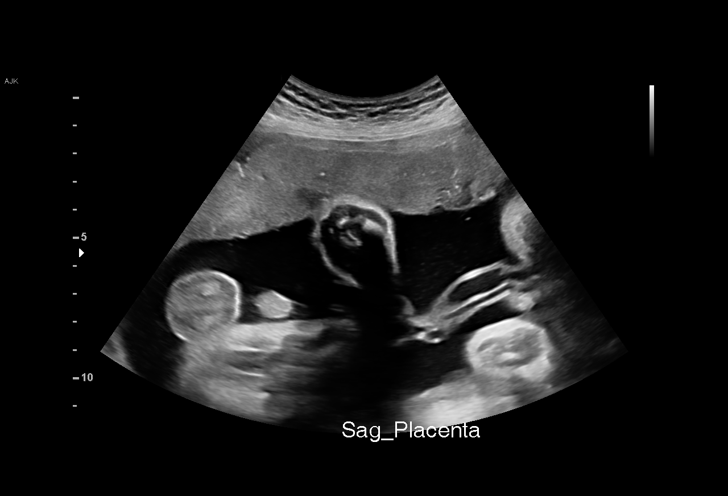
[im 23/36]
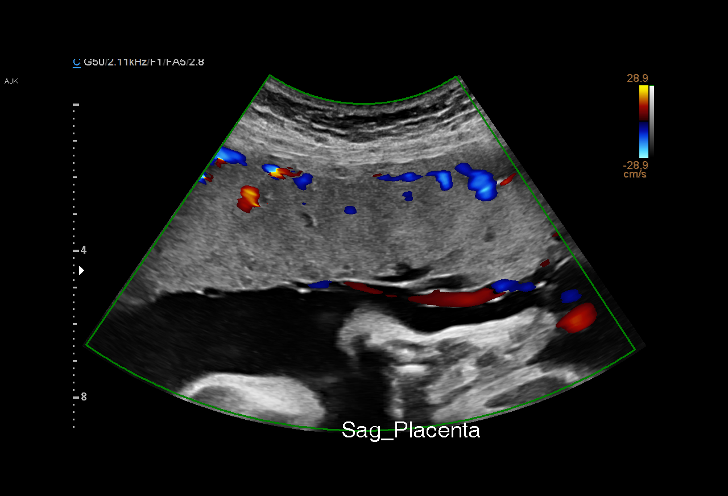
[im 25/36]
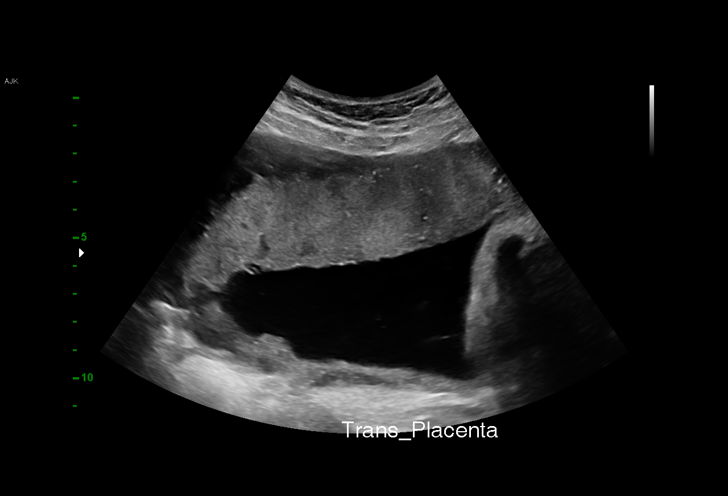
[im 28/36]
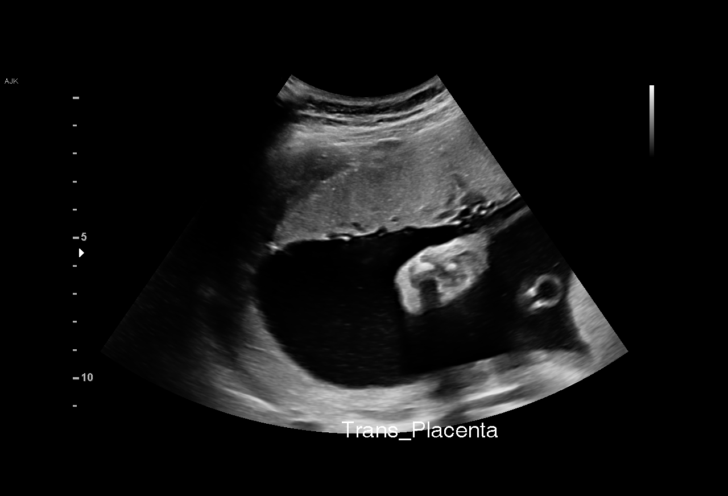
[im 30/36]
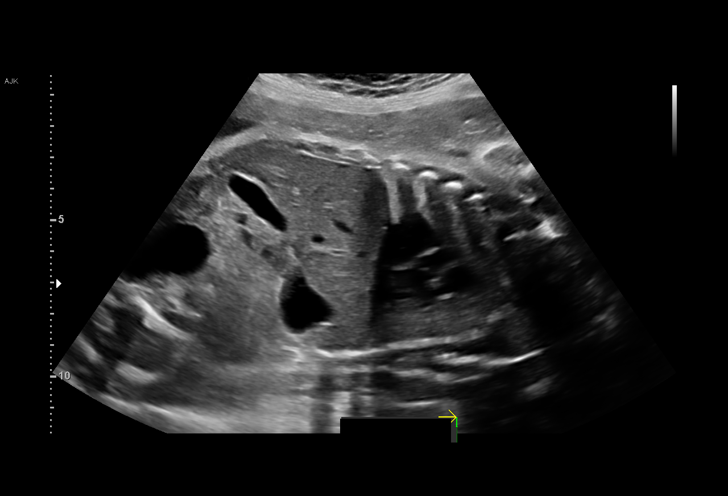
[im 33/36]
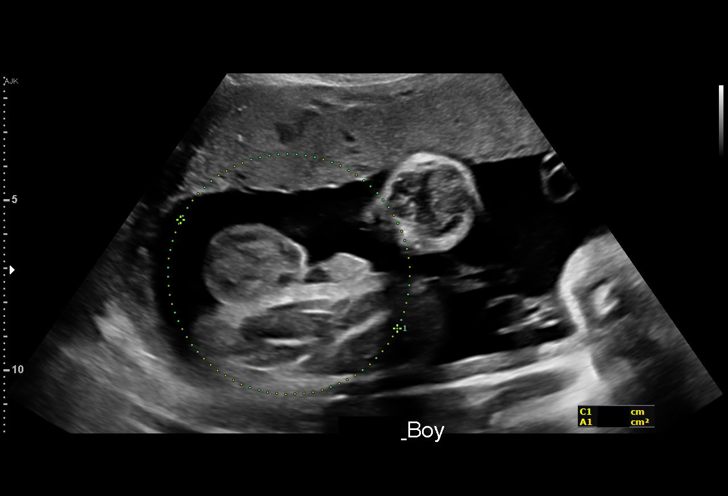
[im 36/36]
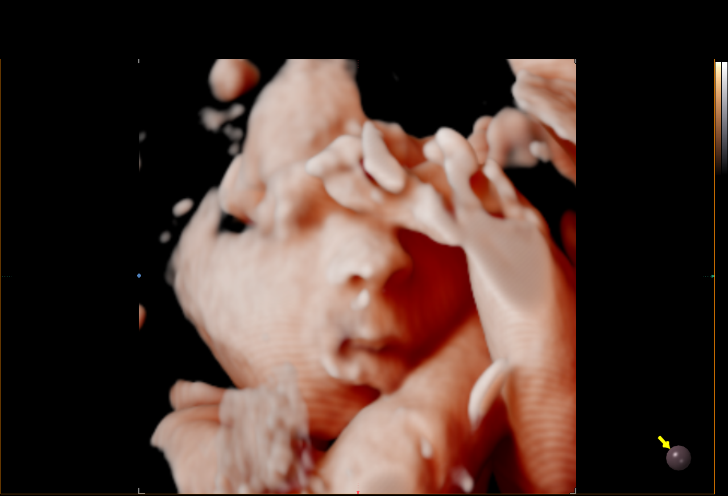

[15 of 28 positions shown; findings below may reference images not displayed]

FINDINGS: 1. Single intrauterine pregnancy.
2. Anterior placenta without evidence of previa.
3. Normal amniotic fluid index.
4. The limited anatomy survey is normal.
5. Normal biophysical profile of [DATE].
Recommendations

1. BPP [DATE].
2. Normal AFI.
3. Management per primary OB.
